# Patient Record
Sex: Female | Born: 1980 | Race: White | Hispanic: No | Marital: Married | Smoking: Never smoker
Health system: Southern US, Community
[De-identification: ages and names within clinical notes are randomized; demographics above are authoritative.]

## PROBLEM LIST (undated history)

## (undated) DIAGNOSIS — Z789 Other specified health status: Secondary | ICD-10-CM

## (undated) DIAGNOSIS — J45909 Unspecified asthma, uncomplicated: Secondary | ICD-10-CM

## (undated) DIAGNOSIS — D759 Disease of blood and blood-forming organs, unspecified: Secondary | ICD-10-CM

## (undated) DIAGNOSIS — E079 Disorder of thyroid, unspecified: Secondary | ICD-10-CM

## (undated) HISTORY — PX: TONSILLECTOMY: SUR1361

## (undated) HISTORY — PX: TUBAL LIGATION: SHX77

---
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
## 2002-09-14 ENCOUNTER — Emergency Department (HOSPITAL_COMMUNITY): Admission: EM | Admit: 2002-09-14 | Discharge: 2002-09-14 | Payer: Self-pay | Admitting: Emergency Medicine

## 2002-09-14 ENCOUNTER — Encounter: Payer: Self-pay | Admitting: Emergency Medicine

## 2003-09-11 ENCOUNTER — Other Ambulatory Visit: Admission: RE | Admit: 2003-09-11 | Discharge: 2003-09-11 | Payer: Self-pay | Admitting: Obstetrics and Gynecology

## 2004-06-17 ENCOUNTER — Ambulatory Visit: Payer: Self-pay | Admitting: Pulmonary Disease

## 2004-06-20 ENCOUNTER — Ambulatory Visit (HOSPITAL_COMMUNITY): Admission: RE | Admit: 2004-06-20 | Discharge: 2004-06-20 | Payer: Self-pay | Admitting: Pulmonary Disease

## 2004-07-04 ENCOUNTER — Ambulatory Visit: Payer: Self-pay | Admitting: Pulmonary Disease

## 2004-09-23 ENCOUNTER — Other Ambulatory Visit: Admission: RE | Admit: 2004-09-23 | Discharge: 2004-09-23 | Payer: Self-pay | Admitting: Obstetrics and Gynecology

## 2005-06-08 ENCOUNTER — Other Ambulatory Visit: Admission: RE | Admit: 2005-06-08 | Discharge: 2005-06-08 | Payer: Self-pay | Admitting: Obstetrics and Gynecology

## 2006-01-31 ENCOUNTER — Ambulatory Visit (HOSPITAL_COMMUNITY): Admission: RE | Admit: 2006-01-31 | Discharge: 2006-01-31 | Payer: Self-pay | Admitting: Otolaryngology

## 2008-12-18 ENCOUNTER — Observation Stay (HOSPITAL_COMMUNITY): Admission: AD | Admit: 2008-12-18 | Discharge: 2008-12-19 | Payer: Self-pay | Admitting: Obstetrics and Gynecology

## 2008-12-28 ENCOUNTER — Inpatient Hospital Stay (HOSPITAL_COMMUNITY): Admission: AD | Admit: 2008-12-28 | Discharge: 2008-12-30 | Payer: Self-pay | Admitting: Obstetrics and Gynecology

## 2009-01-04 ENCOUNTER — Inpatient Hospital Stay (HOSPITAL_COMMUNITY): Admission: AD | Admit: 2009-01-04 | Discharge: 2009-01-07 | Payer: Self-pay | Admitting: Obstetrics and Gynecology

## 2009-01-12 ENCOUNTER — Inpatient Hospital Stay (HOSPITAL_COMMUNITY): Admission: AD | Admit: 2009-01-12 | Discharge: 2009-01-15 | Payer: Self-pay | Admitting: Obstetrics and Gynecology

## 2010-03-21 ENCOUNTER — Encounter: Admission: RE | Admit: 2010-03-21 | Discharge: 2010-03-21 | Payer: Self-pay | Admitting: Physician Assistant

## 2010-05-15 HISTORY — PX: MYOMECTOMY: SHX85

## 2010-08-19 LAB — CCBB MATERNAL DONOR DRAW

## 2010-08-19 LAB — CBC
Hemoglobin: 10.8 g/dL — ABNORMAL LOW (ref 12.0–15.0)
RBC: 3.35 MIL/uL — ABNORMAL LOW (ref 3.87–5.11)
RDW: 13.9 % (ref 11.5–15.5)

## 2010-08-20 LAB — URINALYSIS, ROUTINE W REFLEX MICROSCOPIC
Hgb urine dipstick: NEGATIVE
Nitrite: NEGATIVE
Protein, ur: NEGATIVE mg/dL
Specific Gravity, Urine: 1.02 (ref 1.005–1.030)
Urobilinogen, UA: 0.2 mg/dL (ref 0.0–1.0)

## 2010-08-20 LAB — STREP B DNA PROBE
Strep Group B Ag: POSITIVE
Strep Group B Ag: POSITIVE

## 2010-08-20 LAB — CBC
Hemoglobin: 12 g/dL (ref 12.0–15.0)
MCHC: 33.9 g/dL (ref 30.0–36.0)
MCV: 93.3 fL (ref 78.0–100.0)
Platelets: 159 10*3/uL (ref 150–400)
Platelets: 176 10*3/uL (ref 150–400)
RBC: 3.91 MIL/uL (ref 3.87–5.11)
RBC: 4.06 MIL/uL (ref 3.87–5.11)
RDW: 13.4 % (ref 11.5–15.5)
WBC: 10.2 10*3/uL (ref 4.0–10.5)
WBC: 9.7 10*3/uL (ref 4.0–10.5)

## 2010-08-20 LAB — RPR
RPR Ser Ql: NONREACTIVE
RPR Ser Ql: NONREACTIVE

## 2010-09-27 NOTE — H&P (Signed)
NAME:  Julie [name], Julie [name]               ACCOUNT NO.:  [id number]   MEDICAL RECORD NO.:  [id number]          PATIENT TYPE:  INP   LOCATION:  9153                          [facility]ACILITY:  [facility]   PHYSICIAN:  Duke Salvia. Marcelle Overlie, M.D.DATE OF BIRTH:  Apr 23, 1981   DATE OF ADMISSION:  01/04/2009  DATE OF DISCHARGE:                              HISTORY & PHYSICAL   PRIORITY ADMISSION HISTORY AND PHYSICAL   CHIEF COMPLAINT:  Premature labor.   HISTORY OF PRESENT ILLNESS:  A 30 year old, G1, P0, at 33+ weeks  gestation, EDD of October 7th, was noted to be 3 to 4 cm dilated today  relatively asymptomatic, and was sent to the MAU for admission and  further management of preterm labor.  Of note that she was hospitalized  approximately 3 weeks ago at which time she received betamethasone  series and was started on bedrest at home along with tocolytics in the  form of Procardia 10 mg p.o. t.i.d.  Her blood type positive.  Her 3-  hour GTT has been normal.   Of note that she has had relative polyhydramnios and LGA noted.  Last  exam in our office on August 16th was noted to be 3 cm.   PAST MEDICAL HISTORY:   ALLERGIES:  None.   REVIEW OF SYSTEMS:  Mild asthma.   OTHER MEDICATIONS:  Procardia and prenatal vitamins.  Please see her  [place] form for the remainder of her past medical history.   PHYSICAL EXAMINATION:  VITAL SIGNS:  Temperature 98.2, blood pressure  120/78.  HEENT:  Unremarkable.  NECK:  Supple without masses.  LUNGS:  Clear.  CARDIOVASCULAR:  Regular rate and rhythm without murmurs, rubs, gallops  heard.  BREASTS:  Not examined.  ABDOMEN:  35 cm fundal height.  Fetal heart rate 140.  Cervix was 3 to  4, 80%, vertex, membranes intact.  EXTREMITIES/NEUROLOGIC:  Exam  unremarkable.   IMPRESSION:  1. A 33+ week intrauterine pregnancy.  2. History of large for gestational age and polyhydramnios with normal      3-hour glucose tolerance test.  3. Preterm labor.   PLAN:  Will admit  for bedrest, magnesium sulfate tocolysis, Group B  strep culturing, and further observation.      Richard M. Marcelle Overlie, M.D.  Electronically Signed     RMH/MEDQ  D:  01/04/2009  T:  01/04/2009  Job:  [id number]

## 2010-09-27 NOTE — H&P (Signed)
NAME:  [name], Julie               ACCOUNT NO.:  [id number]   MEDICAL RECORD NO.:  [id number]          PATIENT TYPE:  OBV   LOCATION:  9155                          [facility]:  [facility]   PHYSICIAN:  Dineen Kid. Rana Snare, M.D.    DATE OF BIRTH:  Aug 20, 1980   DATE OF ADMISSION:  12/18/2008  DATE OF DISCHARGE:                              HISTORY & PHYSICAL   HISTORY OF PRESENT ILLNESS:  Ms. Hittle is a 30 year old, G1.  She is at  31-1/7 weeks and was seen in the office by my partner, Dr. Arelia Sneddon.  She  had an ultrasound evaluation for history of polyhydramnios today, noted  to have a vertex fetus weighing 4 pounds 2 ounces which is 87%, AFI is  18.5 which is 78th percentile.  However, the cervical length was noted  to be 1 cm with funneling.  Dr. Arelia Sneddon checked her and found she was  fingertip and long, and sent her to [facility] for further  evaluation.  At [facility], noted to have occasional contractions,  not repetitive, however.  Her EDC is February 18, 2009.  Pregnancy has  otherwise been uncomplicated.  She did have a normal first trimester  screen.  She did have a previous low-lying placenta which had resolved.   PAST MEDICAL HISTORY:  She has mild asthma.   PAST SURGICAL HISTORY:  Tonsillectomy in October 2009.   MEDICATIONS:  Two __________ vitamins a day.   She has no known drug allergies.   PHYSICAL EXAM:  By Dr. Arelia Sneddon.  Blood pressure 120/64.  Fetal heart  rates in the 150s to 160s.  Cervical length is fingertip and long.   IMPRESSION AND PLAN:  Intrauterine pregnancy at 31-1/7 weeks with  cervical change and funneling.   PLAN:  In-hospital monitoring for preterm labor.  We will go ahead and  begin her on IV Unasyn after we do a group B strep culture.  Recommend  betamethasone therapy.  If she is contracting, consider tocolysis.      Dineen Kid Rana Snare, M.D.  Electronically Signed     DCL/MEDQ  D:  12/18/2008  T:  12/18/2008  Job:  [id number]

## 2010-09-27 NOTE — Discharge Summary (Signed)
NAME:  Julie [name], Julie [name]               ACCOUNT NO.:  [id number]   MEDICAL RECORD NO.:  [id number]          PATIENT TYPE:  INP   LOCATION:  9151                          [facility]:  [facility]   PHYSICIAN:  Guy Sandifer. Henderson Cloud, M.D. DATE OF BIRTH:  1980/05/24   DATE OF ADMISSION:  12/28/2008  DATE OF DISCHARGE:  12/30/2008                               DISCHARGE SUMMARY   ADMITTING DIAGNOSES:  1. Intrauterine pregnancy at 70 and 4/7 weeks' estimated additional      age.  2. Preterm labor.   DISCHARGE DIAGNOSES:  1. Intrauterine pregnancy at 71 and 4/7 weeks' estimated additional      age.  2. Preterm labor.   REASON FOR ADMISSION:  The patient is a 30 year old married Hostetler female  G1, P0 at 31 and 4/7 weeks, who had a positive fetal fibronectin on November 21, 2008.  Cervix was noted to be shortened and she had been on the  hospital about a week ago for preterm contractions.  She is on oral  Procardia and bedrest at home.  She presented to the office complaining  of uterine contractions.  Cervix is 3 cm, 80% effaced, -2 station and  vertex.  She is admitted for further management.   HOSPITAL COURSE:  The patient was admitted to the hospital, undergoes IV  fluids, magnesium sulfate tocolysis.  She has had steroids the prior  admission.  Ultrasound on December 29, 2008, reveals a vertex baby with an  estimated fetal weight of 76th percentile, a normal amniotic fluid.  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  She  responds well to the magnesium.  The magnesium was waned on the evening  of December 29, 2008, and on the day of discharge, she is doing well with  mild uterine irritability, on Procardia 20 mg q.8 h.  Baby remains  reactive on the monitor.   CONDITION ON DISCHARGE:  Good.   DIET:  Regular as tolerated.   ACTIVITY:  Modified bedrest and pelvic rest is discussed with the  patient.   MEDICATIONS:  1. Procardia 20 mg q.8 h.  2. Prenatal vitamin daily.   FOLLOWUP:  Follow up in the office in 5 days.     Guy Sandifer Henderson Cloud, M.D.  Electronically Signed    JET/MEDQ  D:  12/30/2008  T:  12/31/2008  Job:  [id number]

## 2010-11-27 IMAGING — US US OB COMP +14 WK
3 series · 14 of 28 positions shown · non-contrast
Comparison: none

OBSTETRICAL ULTRASOUND:
 This ultrasound exam was performed in the [HOSPITAL] Ultrasound Department.  The OB US report was generated in the AS system, and faxed to the ordering physician.  This report is also available in [REDACTED] PACS.

[Series 1: us ob comp +14 wk · 0.41mm/px · 43 acquisitions, 11 frames shown (1 of 3)]
[im 2/43]
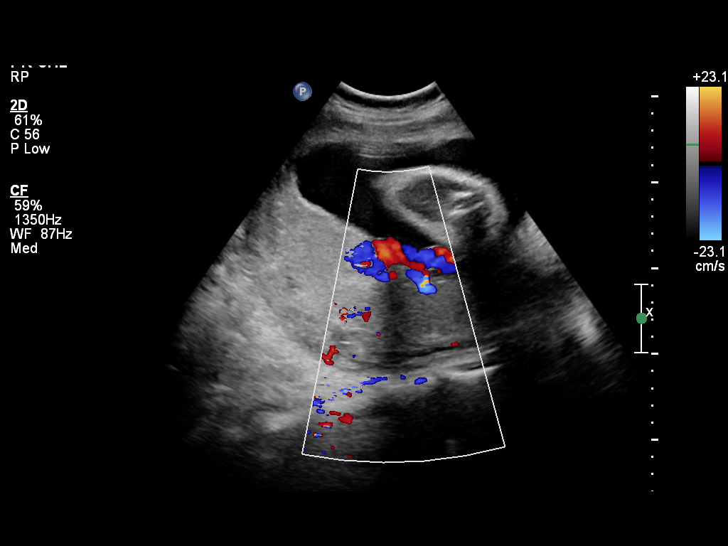
[im 6/43]
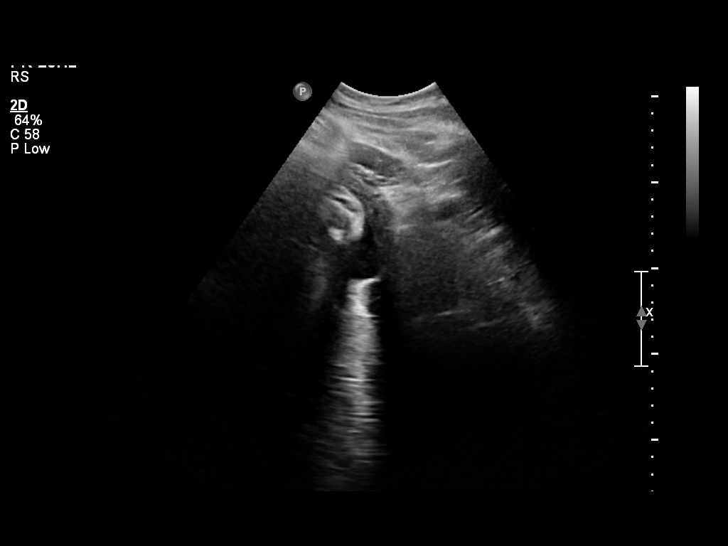
[im 10/43]
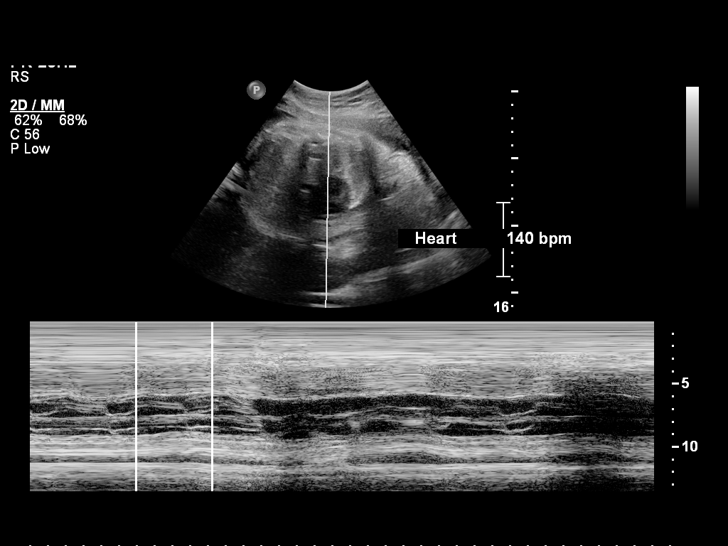
[im 14/43]
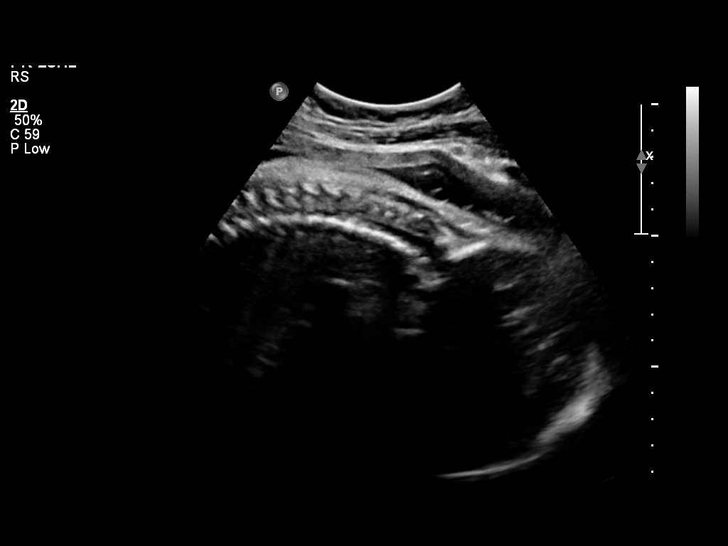
[im 18/43]
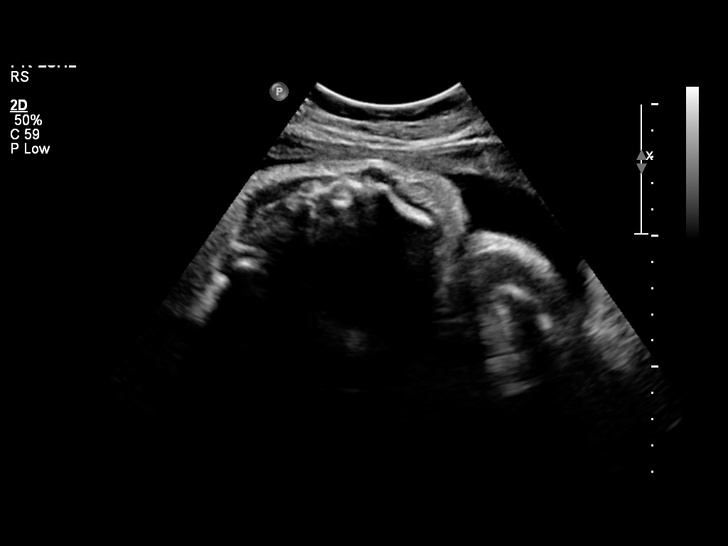
[im 22/43]
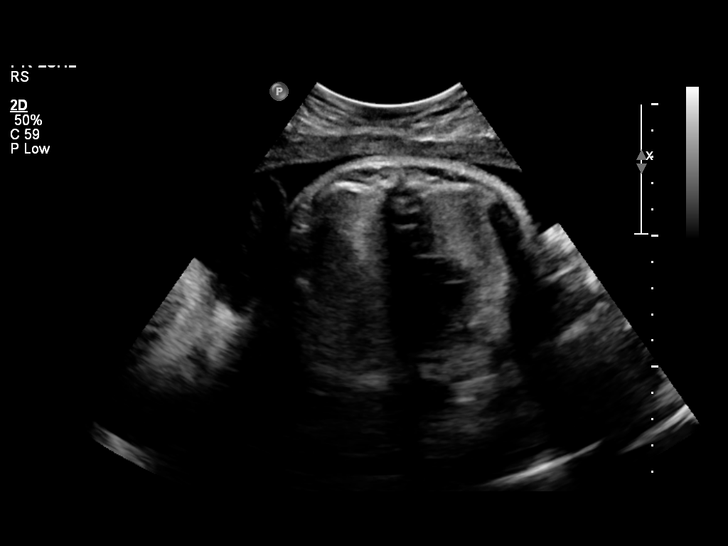
[im 25/43]
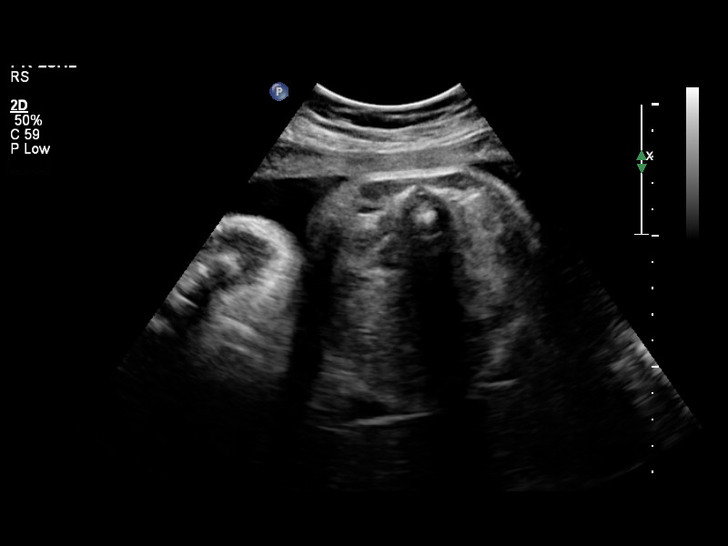
[im 29/43]
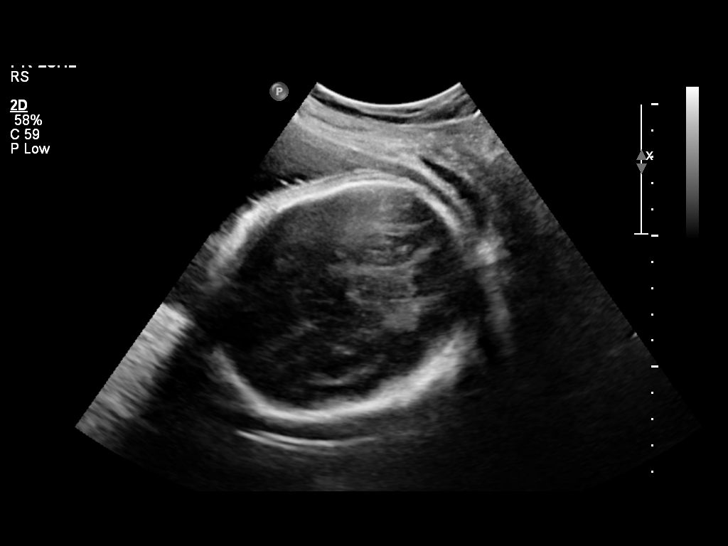
[im 33/43]
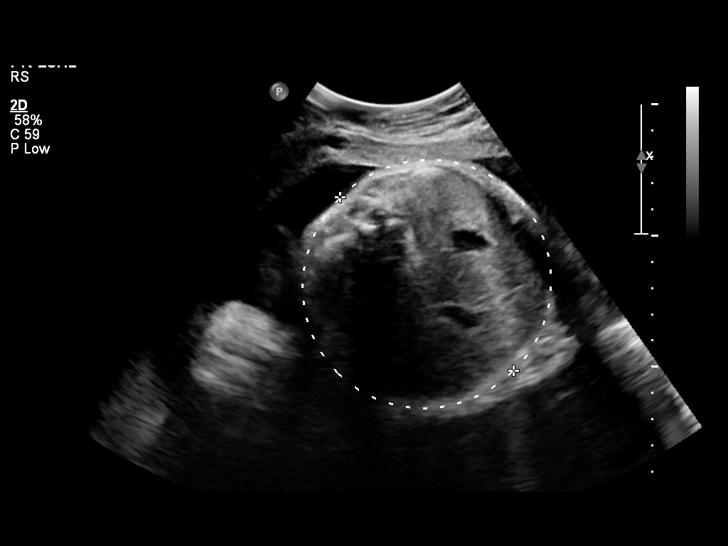
[im 37/43]
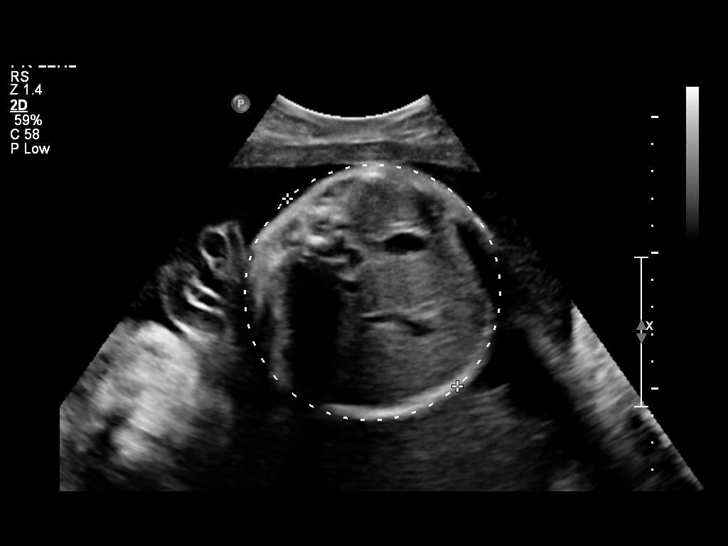
[im 41/43]
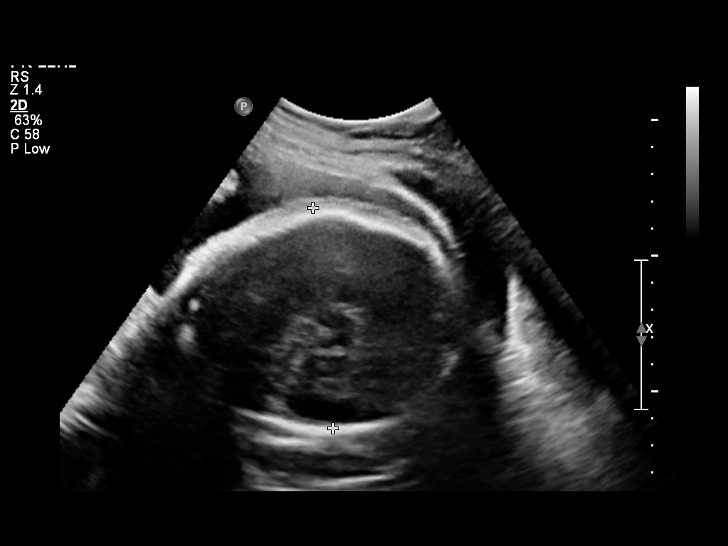

[Series 1: us ob comp +14 wk · 0.30mm/px · 2 of 8 slices shown (2 of 3)]
[im 1/8]
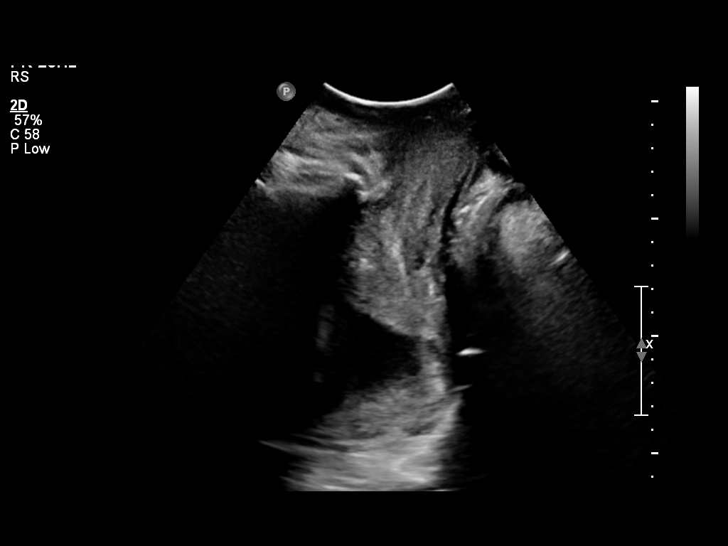
[im 5/8]
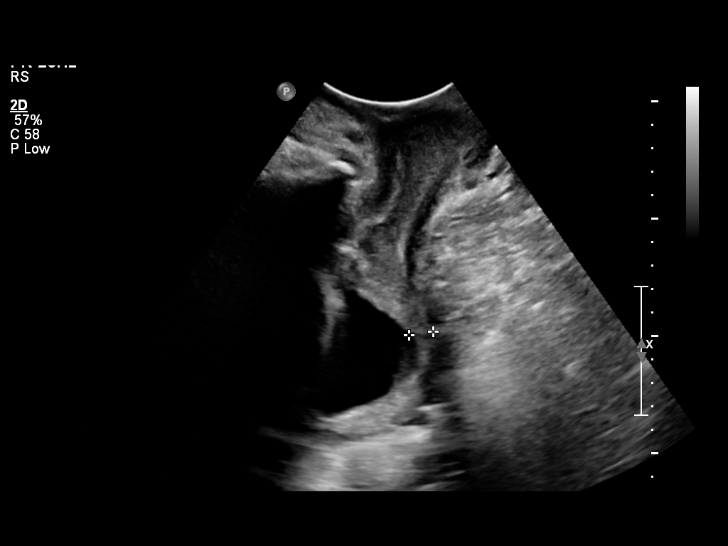

[Series 1: us ob comp +14 wk · 0.37mm/px · 1 of 1 slices shown (3 of 3)]
[im 1/1]
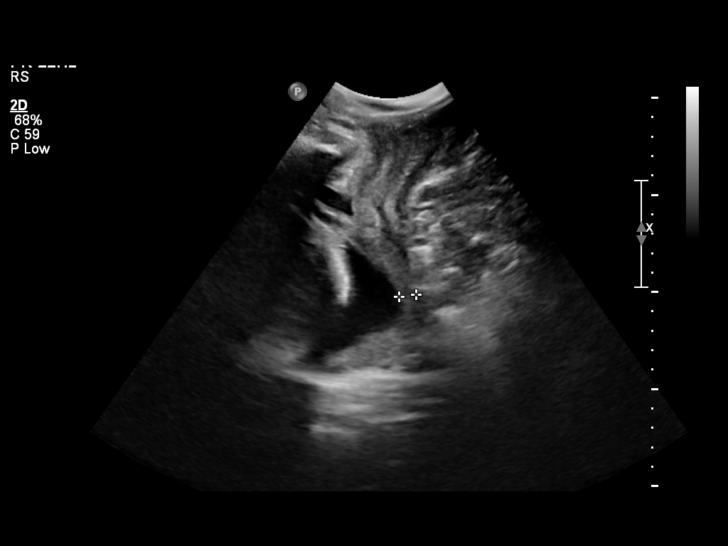

[14 of 28 positions shown; findings below may reference images not displayed]

IMPRESSION: See AS Obstetric US report.

## 2012-05-09 LAB — OB RESULTS CONSOLE ANTIBODY SCREEN: Antibody Screen: NEGATIVE

## 2012-05-09 LAB — OB RESULTS CONSOLE ABO/RH

## 2012-11-20 ENCOUNTER — Encounter (HOSPITAL_COMMUNITY): Payer: Self-pay | Admitting: Pharmacist

## 2012-11-29 ENCOUNTER — Encounter (HOSPITAL_COMMUNITY): Payer: Self-pay

## 2012-12-02 ENCOUNTER — Encounter (HOSPITAL_COMMUNITY): Payer: Self-pay

## 2012-12-02 ENCOUNTER — Inpatient Hospital Stay (HOSPITAL_COMMUNITY)
Admission: AD | Admit: 2012-12-02 | Discharge: 2012-12-07 | DRG: 371 | Disposition: A | Discharge status: Home / Self Care | Payer: BC Managed Care – PPO | Source: Ambulatory Visit | Attending: Obstetrics and Gynecology | Admitting: Obstetrics and Gynecology

## 2012-12-02 ENCOUNTER — Encounter (HOSPITAL_COMMUNITY)
Admission: RE | Admit: 2012-12-02 | Discharge: 2012-12-02 | Disposition: A | Discharge status: Home / Self Care | Payer: BC Managed Care – PPO | Source: Ambulatory Visit | Attending: Obstetrics and Gynecology | Admitting: Obstetrics and Gynecology

## 2012-12-02 DIAGNOSIS — Z302 Encounter for sterilization: Secondary | ICD-10-CM

## 2012-12-02 DIAGNOSIS — O3490 Maternal care for abnormality of pelvic organ, unspecified, unspecified trimester: Principal | ICD-10-CM | POA: Diagnosis present

## 2012-12-02 HISTORY — DX: Other specified health status: Z78.9

## 2012-12-02 LAB — TYPE AND SCREEN
ABO/RH(D): O POS
Antibody Screen: NEGATIVE

## 2012-12-02 LAB — CBC
HCT: 34.1 % — ABNORMAL LOW (ref 36.0–46.0)
Hemoglobin: 11.3 g/dL — ABNORMAL LOW (ref 12.0–15.0)
MCHC: 33.1 g/dL (ref 30.0–36.0)
RDW: 14.3 % (ref 11.5–15.5)
WBC: 7.3 10*3/uL (ref 4.0–10.5)

## 2012-12-02 LAB — ABO/RH: ABO/RH(D): O POS

## 2012-12-02 NOTE — Patient Instructions (Addendum)
Your procedure is scheduled on:12/04/12  Enter through the Main Entrance at :1130 am Pick up desk phone and dial [phone] and inform us of your arrival.  Please call [phone] if you have any problems the morning of surgery.  Remember: Do not eat food after midnight:Tuesday Clear liquids are ok until:9am Wed.   You may brush your teeth the morning of surgery.    DO NOT wear jewelry, eye make-up, lipstick,body lotion, or dark fingernail polish.  (Polished toes are ok) You may wear deodorant.  If you are to be admitted after surgery, leave suitcase in car until your room has been assigned.

## 2012-12-02 NOTE — Pre-Procedure Instructions (Signed)
Dr. Malen Gauze notified of pt's low platelet count and he has ordered a repeat test on DOS.

## 2012-12-04 ENCOUNTER — Encounter (HOSPITAL_COMMUNITY): Payer: Self-pay | Admitting: Anesthesiology

## 2012-12-04 ENCOUNTER — Encounter (HOSPITAL_COMMUNITY): Payer: Self-pay | Admitting: Certified Registered"

## 2012-12-04 ENCOUNTER — Inpatient Hospital Stay (HOSPITAL_COMMUNITY): Payer: BC Managed Care – PPO | Admitting: Anesthesiology

## 2012-12-04 ENCOUNTER — Encounter (HOSPITAL_COMMUNITY)
Admission: AD | Disposition: A | Discharge status: Home / Self Care | Payer: Self-pay | Source: Ambulatory Visit | Attending: Obstetrics and Gynecology

## 2012-12-04 LAB — CBC
MCH: 29.5 pg (ref 26.0–34.0)
Platelets: 131 10*3/uL — ABNORMAL LOW (ref 150–400)
RBC: 3.96 MIL/uL (ref 3.87–5.11)
RDW: 13.9 % (ref 11.5–15.5)

## 2012-12-04 SURGERY — Surgical Case
Anesthesia: Spinal | Laterality: Bilateral | Wound class: Clean Contaminated

## 2012-12-04 MED ORDER — ONDANSETRON HCL 4 MG PO TABS: 4.0000 mg | ORAL_TABLET | ORAL | Status: DC | PRN

## 2012-12-04 MED ORDER — NALOXONE HCL 0.4 MG/ML IJ SOLN: 0.4000 mg | INTRAMUSCULAR | Status: DC | PRN

## 2012-12-04 MED ORDER — NALOXONE HCL 1 MG/ML IJ SOLN
1.0000 ug/kg/h | INTRAVENOUS | Status: DC | PRN
  Filled 2012-12-04: qty 2

## 2012-12-04 MED ORDER — NALBUPHINE HCL 10 MG/ML IJ SOLN: 5.0000 mg | INTRAMUSCULAR | Status: DC | PRN

## 2012-12-04 MED ORDER — BUPIVACAINE IN DEXTROSE 0.75-8.25 % IT SOLN
INTRATHECAL | Status: DC | PRN
  Administered 2012-12-04: 1.4 mL via INTRATHECAL

## 2012-12-04 MED ORDER — KETOROLAC TROMETHAMINE 60 MG/2ML IM SOLN
INTRAMUSCULAR | Status: AC
  Administered 2012-12-04: 60 mg via INTRAMUSCULAR
  Filled 2012-12-04: qty 2

## 2012-12-04 MED ORDER — TETANUS-DIPHTH-ACELL PERTUSSIS 5-2.5-18.5 LF-MCG/0.5 IM SUSP: 0.5000 mL | Freq: Once | INTRAMUSCULAR | Status: DC

## 2012-12-04 MED ORDER — KETOROLAC TROMETHAMINE 30 MG/ML IJ SOLN: 30.0000 mg | Freq: Four times a day (QID) | INTRAMUSCULAR | Status: AC | PRN

## 2012-12-04 MED ORDER — PHENYLEPHRINE 40 MCG/ML (10ML) SYRINGE FOR IV PUSH (FOR BLOOD PRESSURE SUPPORT)
PREFILLED_SYRINGE | INTRAVENOUS | Status: AC
  Filled 2012-12-04: qty 10

## 2012-12-04 MED ORDER — LACTATED RINGERS IV SOLN
INTRAVENOUS | Status: DC
  Administered 2012-12-04 (×3): via INTRAVENOUS

## 2012-12-04 MED ORDER — DIPHENHYDRAMINE HCL 25 MG PO CAPS: 25.0000 mg | ORAL_CAPSULE | Freq: Four times a day (QID) | ORAL | Status: DC | PRN

## 2012-12-04 MED ORDER — OXYTOCIN 40 UNITS IN LACTATED RINGERS INFUSION - SIMPLE MED: 62.5000 mL/h | INTRAVENOUS | Status: AC

## 2012-12-04 MED ORDER — MORPHINE SULFATE (PF) 0.5 MG/ML IJ SOLN
INTRAMUSCULAR | Status: DC | PRN
  Administered 2012-12-04: .2 mg via INTRATHECAL

## 2012-12-04 MED ORDER — ONDANSETRON HCL 4 MG/2ML IJ SOLN
INTRAMUSCULAR | Status: AC
  Filled 2012-12-04: qty 2

## 2012-12-04 MED ORDER — KETOROLAC TROMETHAMINE 60 MG/2ML IM SOLN: 60.0000 mg | Freq: Once | INTRAMUSCULAR | Status: AC | PRN

## 2012-12-04 MED ORDER — KETOROLAC TROMETHAMINE 30 MG/ML IJ SOLN: 15.0000 mg | Freq: Once | INTRAMUSCULAR | Status: DC | PRN

## 2012-12-04 MED ORDER — MORPHINE SULFATE 0.5 MG/ML IJ SOLN
INTRAMUSCULAR | Status: AC
  Filled 2012-12-04: qty 10

## 2012-12-04 MED ORDER — DIPHENHYDRAMINE HCL 50 MG/ML IJ SOLN: 25.0000 mg | INTRAMUSCULAR | Status: DC | PRN

## 2012-12-04 MED ORDER — OXYCODONE-ACETAMINOPHEN 5-325 MG PO TABS
1.0000 | ORAL_TABLET | ORAL | Status: DC | PRN
  Administered 2012-12-05 – 2012-12-07 (×8): 1 via ORAL
  Filled 2012-12-04 (×8): qty 1

## 2012-12-04 MED ORDER — FENTANYL CITRATE 0.05 MG/ML IJ SOLN
INTRAMUSCULAR | Status: DC | PRN
  Administered 2012-12-04: 12.5 ug via INTRATHECAL

## 2012-12-04 MED ORDER — OXYTOCIN 10 UNIT/ML IJ SOLN
40.0000 [IU] | INTRAVENOUS | Status: DC | PRN
  Administered 2012-12-04: 40 [IU] via INTRAVENOUS

## 2012-12-04 MED ORDER — CEFAZOLIN SODIUM-DEXTROSE 2-3 GM-% IV SOLR
2.0000 g | INTRAVENOUS | Status: AC
  Administered 2012-12-04: 2 g via INTRAVENOUS

## 2012-12-04 MED ORDER — SODIUM CHLORIDE 0.9 % IJ SOLN: 3.0000 mL | INTRAMUSCULAR | Status: DC | PRN

## 2012-12-04 MED ORDER — CEFAZOLIN SODIUM-DEXTROSE 2-3 GM-% IV SOLR
INTRAVENOUS | Status: AC
  Filled 2012-12-04: qty 50

## 2012-12-04 MED ORDER — SIMETHICONE 80 MG PO CHEW
80.0000 mg | CHEWABLE_TABLET | Freq: Three times a day (TID) | ORAL | Status: DC
  Administered 2012-12-04 – 2012-12-07 (×10): 80 mg via ORAL

## 2012-12-04 MED ORDER — DIPHENHYDRAMINE HCL 25 MG PO CAPS
25.0000 mg | ORAL_CAPSULE | ORAL | Status: DC | PRN
  Filled 2012-12-04: qty 1

## 2012-12-04 MED ORDER — IBUPROFEN 600 MG PO TABS
600.0000 mg | ORAL_TABLET | Freq: Four times a day (QID) | ORAL | Status: DC
  Administered 2012-12-05 – 2012-12-07 (×9): 600 mg via ORAL
  Filled 2012-12-04 (×9): qty 1

## 2012-12-04 MED ORDER — LACTATED RINGERS IV SOLN
INTRAVENOUS | Status: DC
  Administered 2012-12-04 – 2012-12-05 (×2): via INTRAVENOUS

## 2012-12-04 MED ORDER — SCOPOLAMINE 1 MG/3DAYS TD PT72: 1.0000 | MEDICATED_PATCH | Freq: Once | TRANSDERMAL | Status: DC

## 2012-12-04 MED ORDER — FENTANYL CITRATE 0.05 MG/ML IJ SOLN
INTRAMUSCULAR | Status: AC
  Filled 2012-12-04: qty 2

## 2012-12-04 MED ORDER — METOCLOPRAMIDE HCL 5 MG/ML IJ SOLN: 10.0000 mg | Freq: Three times a day (TID) | INTRAMUSCULAR | Status: DC | PRN

## 2012-12-04 MED ORDER — ONDANSETRON HCL 4 MG/2ML IJ SOLN: 4.0000 mg | Freq: Three times a day (TID) | INTRAMUSCULAR | Status: DC | PRN

## 2012-12-04 MED ORDER — PROMETHAZINE HCL 25 MG/ML IJ SOLN: 6.2500 mg | INTRAMUSCULAR | Status: DC | PRN

## 2012-12-04 MED ORDER — HYDROMORPHONE HCL PF 1 MG/ML IJ SOLN: 0.2500 mg | INTRAMUSCULAR | Status: DC | PRN

## 2012-12-04 MED ORDER — SENNOSIDES-DOCUSATE SODIUM 8.6-50 MG PO TABS
2.0000 | ORAL_TABLET | Freq: Every day | ORAL | Status: DC
  Administered 2012-12-05 – 2012-12-06 (×4): 2 via ORAL

## 2012-12-04 MED ORDER — PRENATAL MULTIVITAMIN CH
1.0000 | ORAL_TABLET | Freq: Every day | ORAL | Status: DC
  Administered 2012-12-05 – 2012-12-06 (×2): 1 via ORAL
  Filled 2012-12-04 (×2): qty 1

## 2012-12-04 MED ORDER — SIMETHICONE 80 MG PO CHEW: 80.0000 mg | CHEWABLE_TABLET | ORAL | Status: DC | PRN

## 2012-12-04 MED ORDER — DIPHENHYDRAMINE HCL 50 MG/ML IJ SOLN: 12.5000 mg | INTRAMUSCULAR | Status: DC | PRN

## 2012-12-04 MED ORDER — 0.9 % SODIUM CHLORIDE (POUR BTL) OPTIME
TOPICAL | Status: DC | PRN
  Administered 2012-12-04: 1000 mL

## 2012-12-04 MED ORDER — NALBUPHINE SYRINGE 5 MG/0.5 ML
INJECTION | INTRAMUSCULAR | Status: AC
  Administered 2012-12-04: 10 mg via SUBCUTANEOUS
  Filled 2012-12-04: qty 1

## 2012-12-04 MED ORDER — SCOPOLAMINE 1 MG/3DAYS TD PT72
MEDICATED_PATCH | TRANSDERMAL | Status: AC
  Administered 2012-12-04: 1.5 mg via TRANSDERMAL
  Filled 2012-12-04: qty 1

## 2012-12-04 MED ORDER — DIBUCAINE 1 % RE OINT: 1.0000 "application " | TOPICAL_OINTMENT | RECTAL | Status: DC | PRN

## 2012-12-04 MED ORDER — MEPERIDINE HCL 25 MG/ML IJ SOLN: 6.2500 mg | INTRAMUSCULAR | Status: DC | PRN

## 2012-12-04 MED ORDER — LANOLIN HYDROUS EX OINT: 1.0000 "application " | TOPICAL_OINTMENT | CUTANEOUS | Status: DC | PRN

## 2012-12-04 MED ORDER — KETOROLAC TROMETHAMINE 30 MG/ML IJ SOLN
30.0000 mg | Freq: Four times a day (QID) | INTRAMUSCULAR | Status: AC | PRN
  Administered 2012-12-04: 30 mg via INTRAVENOUS
  Filled 2012-12-04: qty 1

## 2012-12-04 MED ORDER — OXYTOCIN 10 UNIT/ML IJ SOLN
INTRAMUSCULAR | Status: AC
  Filled 2012-12-04: qty 4

## 2012-12-04 MED ORDER — MENTHOL 3 MG MT LOZG: 1.0000 | LOZENGE | OROMUCOSAL | Status: DC | PRN

## 2012-12-04 MED ORDER — ZOLPIDEM TARTRATE 5 MG PO TABS: 5.0000 mg | ORAL_TABLET | Freq: Every evening | ORAL | Status: DC | PRN

## 2012-12-04 MED ORDER — WITCH HAZEL-GLYCERIN EX PADS: 1.0000 "application " | MEDICATED_PAD | CUTANEOUS | Status: DC | PRN

## 2012-12-04 MED ORDER — ONDANSETRON HCL 4 MG/2ML IJ SOLN
INTRAMUSCULAR | Status: DC | PRN
  Administered 2012-12-04: 4 mg via INTRAVENOUS

## 2012-12-04 MED ORDER — ONDANSETRON HCL 4 MG/2ML IJ SOLN: 4.0000 mg | INTRAMUSCULAR | Status: DC | PRN

## 2012-12-04 MED ORDER — PHENYLEPHRINE HCL 10 MG/ML IJ SOLN
INTRAMUSCULAR | Status: DC | PRN
  Administered 2012-12-04 (×3): 80 ug via INTRAVENOUS
  Administered 2012-12-04: 40 ug via INTRAVENOUS
  Administered 2012-12-04 (×2): 80 ug via INTRAVENOUS

## 2012-12-04 MED ORDER — NALBUPHINE HCL 10 MG/ML IJ SOLN
5.0000 mg | INTRAMUSCULAR | Status: DC | PRN
  Administered 2012-12-04 – 2012-12-05 (×2): 5 mg via INTRAVENOUS
  Filled 2012-12-04 (×2): qty 1

## 2012-12-04 SURGICAL SUPPLY — 31 items
ADH SKN CLS APL DERMABOND .7 (GAUZE/BANDAGES/DRESSINGS)
APL SKNCLS STERI-STRIP NONHPOA (GAUZE/BANDAGES/DRESSINGS) ×1
BENZOIN TINCTURE PRP APPL 2/3 (GAUZE/BANDAGES/DRESSINGS) ×2 IMPLANT
CLAMP CORD UMBIL (MISCELLANEOUS) IMPLANT
CLOTH BEACON ORANGE TIMEOUT ST (SAFETY) ×2 IMPLANT
CONTAINER PREFILL 10% NBF 15ML (MISCELLANEOUS) ×3 IMPLANT
DERMABOND ADVANCED (GAUZE/BANDAGES/DRESSINGS)
DERMABOND ADVANCED .7 DNX12 (GAUZE/BANDAGES/DRESSINGS) IMPLANT
DRAPE LG THREE QUARTER DISP (DRAPES) ×2 IMPLANT
DRSG OPSITE POSTOP 4X10 (GAUZE/BANDAGES/DRESSINGS) ×2 IMPLANT
DURAPREP 26ML APPLICATOR (WOUND CARE) ×2 IMPLANT
ELECT REM PT RETURN 9FT ADLT (ELECTROSURGICAL) ×2
ELECTRODE REM PT RTRN 9FT ADLT (ELECTROSURGICAL) ×1 IMPLANT
EXTRACTOR VACUUM M CUP 4 TUBE (SUCTIONS) IMPLANT
GLOVE BIO SURGEON STRL SZ7.5 (GLOVE) ×2 IMPLANT
GOWN STRL REIN XL XLG (GOWN DISPOSABLE) ×4 IMPLANT
KIT ABG SYR 3ML LUER SLIP (SYRINGE) ×2 IMPLANT
NEEDLE HYPO 25X5/8 SAFETYGLIDE (NEEDLE) ×2 IMPLANT
NS IRRIG 1000ML POUR BTL (IV SOLUTION) ×2 IMPLANT
PACK C SECTION WH (CUSTOM PROCEDURE TRAY) ×2 IMPLANT
PAD OB MATERNITY 4.3X12.25 (PERSONAL CARE ITEMS) ×2 IMPLANT
STAPLER VISISTAT 35W (STAPLE) IMPLANT
STRIP CLOSURE SKIN 1/2X4 (GAUZE/BANDAGES/DRESSINGS) ×2 IMPLANT
SUT MNCRL 0 VIOLET CTX 36 (SUTURE) ×4 IMPLANT
SUT MONOCRYL 0 CTX 36 (SUTURE) ×4
SUT PDS AB 0 CTX 60 (SUTURE) ×2 IMPLANT
SUT PLAIN 0 NONE (SUTURE) IMPLANT
SUT VIC AB 4-0 KS 27 (SUTURE) ×2 IMPLANT
TOWEL OR 17X24 6PK STRL BLUE (TOWEL DISPOSABLE) ×6 IMPLANT
TRAY FOLEY CATH 14FR (SET/KITS/TRAYS/PACK) ×2 IMPLANT
WATER STERILE IRR 1000ML POUR (IV SOLUTION) ×2 IMPLANT

## 2012-12-04 NOTE — Anesthesia Procedure Notes (Signed)
Spinal  Patient location during procedure: OR Start time: 12/04/2012 1:18 PM End time: 12/04/2012 1:20 PM Staffing Anesthesiologist: Sandrea Hughs Performed by: anesthesiologist  Preanesthetic Checklist Completed: patient identified, surgical consent, pre-op evaluation, timeout performed, IV checked, risks and benefits discussed and monitors and equipment checked Spinal Block Patient position: sitting Prep: DuraPrep Patient monitoring: heart rate, cardiac monitor, continuous pulse ox and blood pressure Approach: midline Location: L3-4 Injection technique: single-shot Needle Needle type: Sprotte  Needle gauge: 24 G Needle length: 9 cm Needle insertion depth: 5 cm Assessment Sensory level: T4

## 2012-12-04 NOTE — Anesthesia Postprocedure Evaluation (Signed)
Anesthesia Post Note  Patient: Julie [name]  Procedure(s) Performed: Procedure(s) (LRB): PRIMARY CESAREAN SECTION WITH BILATERAL TUBAL LIGATION (Bilateral)  Anesthesia type: Spinal  Patient location: PACU  Post pain: Pain level controlled  Post assessment: Post-op Vital signs reviewed  Last Vitals:  Filed Vitals:   12/04/12 1438  BP:   Pulse: 67  Temp: 36.5 C  Resp: 16    Post vital signs: Reviewed  Level of consciousness: awake  Complications: No apparent anesthesia complications

## 2012-12-04 NOTE — Preoperative (Signed)
Beta Blockers   Reason not to administer Beta Blockers:Not Applicable 

## 2012-12-04 NOTE — Brief Op Note (Signed)
12/04/2012  2:27 PM  PATIENT:  Julie [name]  32 y.o. female  PRE-OPERATIVE DIAGNOSIS:  PRIMARY Cesarean SECTION, PREVIOUS MYOMECTOMY, DESIRES STERILIZATION   POST-OPERATIVE DIAGNOSIS:  Previous myomectomy, desires permanent sterilization  PROCEDURE:  Procedure(s) with comments: PRIMARY CESAREAN SECTION WITH BILATERAL TUBAL LIGATION (Bilateral) - PRIMARY  edc 8/5, biopsy of uterine fundus  SURGEON:  Surgeon(s) and Role:    * Leslie Andrea, MD - Primary    * Mitchel Honour, DO - Assisting  PHYSICIAN ASSISTANT:   ASSISTANTS: Megan Morris DO   ANESTHESIA:   spinal  EBL:  Total I/O In: 2000 [I.V.:2000] Out: 650 [Urine:150; Blood:500]  BLOOD ADMINISTERED:none  DRAINS: Urinary Catheter (Foley)   LOCAL MEDICATIONS USED:  NONE  SPECIMEN:  No Specimen  DISPOSITION OF SPECIMEN:  PATHOLOGY  COUNTS:  YES  TOURNIQUET:  * No tourniquets in log *  DICTATION: .Dragon Dictation and Other Dictation: Dictation Number [id number]  PLAN OF CARE: Admit to inpatient   PATIENT DISPOSITION:  PACU - hemodynamically stable.   Delay start of Pharmacological VTE agent (>24hrs) due to surgical blood loss or risk of bleeding: not applicable

## 2012-12-04 NOTE — Anesthesia Preprocedure Evaluation (Signed)
Anesthesia Evaluation  Patient identified by MRN, date of birth, ID band Patient awake    Reviewed: Allergy & Precautions, H&P , NPO status , Patient's Chart, lab work & pertinent test results  Airway Mallampati: I TM Distance: >3 FB Neck ROM: full    Dental no notable dental hx.    Pulmonary neg pulmonary ROS,    Pulmonary exam normal       Cardiovascular negative cardio ROS      Neuro/Psych negative neurological ROS  negative psych ROS   GI/Hepatic negative GI ROS, Neg liver ROS,   Endo/Other  negative endocrine ROS  Renal/GU negative Renal ROS  negative genitourinary   Musculoskeletal negative musculoskeletal ROS (+)   Abdominal Normal abdominal exam  (+)   Peds negative pediatric ROS (+)  Hematology negative hematology ROS (+)   Anesthesia Other Findings   Reproductive/Obstetrics (+) Pregnancy                           Anesthesia Physical Anesthesia Plan  ASA: II  Anesthesia Plan: Spinal   Post-op Pain Management:    Induction:   Airway Management Planned:   Additional Equipment:   Intra-op Plan:   Post-operative Plan:   Informed Consent: I have reviewed the patients History and Physical, chart, labs and discussed the procedure including the risks, benefits and alternatives for the proposed anesthesia with the patient or authorized representative who has indicated his/her understanding and acceptance.     Plan Discussed with: CRNA and Surgeon  Anesthesia Plan Comments:         Anesthesia Quick Evaluation

## 2012-12-04 NOTE — H&P (Signed)
Analyse Angst is a 32 y.o. female presenting for primary cesarean section and BTL secondary to hx of uterine myomectomy.  Maternal Medical History:  Fetal activity: Perceived fetal activity is normal.      OB History   Grav Para Term Preterm Abortions TAB SAB Ect Mult Living   2 1  1      1      Past Medical History  Diagnosis Date  . Medical history non-contributory    Past Surgical History  Procedure Laterality Date  . Myomectomy  2012   Family History: family history is not on file. Social History:  reports that she has never smoked. She does not have any smokeless tobacco history on file. She reports that she does not drink alcohol or use illicit drugs.   Prenatal Transfer Tool  Maternal Diabetes: No Genetic Screening: Normal Maternal Ultrasounds/Referrals: Normal Fetal Ultrasounds or other Referrals:  None Maternal Substance Abuse:  No Significant Maternal Medications:  None Significant Maternal Lab Results:  None Other Comments:  None  Review of Systems  Eyes: Negative for blurred vision.  Gastrointestinal: Negative for abdominal pain.  Neurological: Negative for headaches.      Blood pressure 133/88, pulse 82, temperature 98.1 F (36.7 C), temperature source Oral, resp. rate 18, last menstrual period 03/12/2012, SpO2 100.00%. Exam Physical Exam  Cardiovascular: Normal rate and regular rhythm.   Respiratory: Effort normal and breath sounds normal.  GI: Soft. There is no tenderness.  Neurological: She has normal reflexes.    Prenatal labs: ABO, Rh: --/--/O POS, O POS (07/21 0920) Antibody: NEG (07/21 0920) Rubella: Immune (12/26 0000) RPR: NON REACTIVE (07/21 0920)  HBsAg: Negative (12/26 0000)  HIV: Non-reactive (12/26 0000)  GBS:     Assessment/Plan: 32 yo G2P1 for C/S and BTL D/W patient risks-infection, organ damage, DVT/PE, bleeding/transfusion-HIV/Hep, pneumonia.  D/W BTL and permanence, alternatives, failure rate and increased ectopic  risk.   Jamee Pacholski II,Labib Cwynar E 12/04/2012, 12:50 PM

## 2012-12-04 NOTE — Transfer of Care (Signed)
Immediate Anesthesia Transfer of Care Note  Patient: Julie [name]  Procedure(s) Performed: Procedure(s) with comments: PRIMARY CESAREAN SECTION WITH BILATERAL TUBAL LIGATION (Bilateral) - PRIMARY  edc 8/5  Patient Location: PACU  Anesthesia Type:Spinal  Level of Consciousness: awake, alert  and oriented  Airway & Oxygen Therapy: Patient Spontanous Breathing  Post-op Assessment: Report given to PACU RN and Post -op Vital signs reviewed and stable  Post vital signs: Reviewed and stable  Complications: No apparent anesthesia complications

## 2012-12-04 NOTE — Op Note (Signed)
NAME:  Julie [name], Julie [name]               ACCOUNT NO.:  [id number]  MEDICAL RECORD NO.:  [id number]  LOCATION:  9102                          FACILITY:  [facility]  PHYSICIAN:  Guy Sandifer. Henderson Cloud, M.D. DATE OF BIRTH:  Apr 27, 1981  DATE OF PROCEDURE:  12/04/2012 DATE OF DISCHARGE:                              OPERATIVE REPORT   PREOPERATIVE DIAGNOSES: 1. Intrauterine pregnancy at 38-1/7th weeks. 2. Previous uterine myomectomy. 3. Desires permanent sterilization.  POSTOPERATIVE DIAGNOSES: 1. Intrauterine pregnancy at 38-1/7th weeks. 2. Previous uterine myomectomy. 3. Desires permanent sterilization.  PROCEDURE: 1. Primary low transverse cesarean section. 2. Bilateral tubal ligation. 3. Biopsy of the uterine fundus.  SURGEON:  Guy Sandifer. Henderson Cloud, MD  ASSISTANT:  Mitchel Honour DO  ANESTHESIA:  Spinal.  ESTIMATED BLOOD LOSS:  750 mL.  FINDINGS:  Viable female infant, Apgars of 9 and 9 at 1 and 5 minutes respectively.  Arterial cord pH 7.14.  SPECIMENS:  Placenta to Pathology.  INDICATIONS AND CONSENT:  This patient is a 32 year old married Julie [name] female, G2, P1, who is status post robotic uterine myomectomy.  Cesarean section was recommended.  The patient also desires permanent sterilization.  Potential risks and complications have been reviewed preoperatively including, but not limited to, infection, organ damage, bleeding requiring transfusion of blood products with HIV and hepatitis acquisition, DVT, PE, pneumonia.  Permanence of the tubal ligation, alternatives, increased ectopic risk, and failure rate have been reviewed for the tubal ligation as well.  All questions has been answered and consent is signed on the chart.  PROCEDURE IN DETAIL:  The patient was taken to the operating room where she is identified.  Spinal anesthetic was placed per Anesthesiology and she was placed in a dorsal supine position with a 15 degree left lateral wedge.  Time-out was undertaken.  She was then  prepped.  Foley catheter was placed, and draped in sterile fashion per [facility] protocol. After testing for adequate spinal anesthesia, skin was entered through a Pfannenstiel incision and dissection was carried out in layers to the peritoneum which was incised and extended superiorly and inferiorly. Vesicouterine peritoneum was taken down cephalad laterally.  Bladder flap was developed.  The bladder blade was placed.  Uterus was incised in a low transverse manner and the uterine cavity was entered bluntly with a hemostat.  Clear fluid was noted.  Uterine incision was extended cephalolaterally with the fingers.  Vertex was delivered without difficulty.  Remainder of the baby was delivered.  A vigorous baby with a good cry and tone was noted.  Cord was clamped and cut and the baby was handed to awaiting pediatrics team.  Placenta was manually delivered and sent to Pathology.  Uterus was clean.  Uterus was closed in 2 running locking imbricating layers of 0 Monocryl suture which achieves good hemostasis.  There are numerous subserosal 1-3 cm uterine leiomyoma noted on the fundus.  When manipulating the uterine fundus to expose the left fallopian tube, a small piece of tissue is partially avulsed. Inspection reveals it to be probable scar tissue secondary to history of myomectomy.  The remainder of the tissue was removed with cautery and sent to Pathology.  Hemostasis was obtained on the  peritoneum at that location with cautery.  The left fallopian tube was then identified from cornu to fimbria.  It was grasped in its mid ampullary portion with a Babcock clamp.  A knuckle of tube was then doubly ligated with 2 free ties of plain suture.  The intervening tube was then sharply resected with Metzenbaum scissors.  Cautery was used to assure complete hemostasis.  Similar procedure was carried out on the right side.  Both ovaries appeared normal.  Irrigation was carried out and now returns  as clear.  The anterior peritoneum was then closed in a running fashion with 0 Monocryl suture which was also used to reapproximate the pyramidalis muscle in midline.  Anterior rectus fascia was closed in a running fashion with a 0 looped PDS suture.  Subcutaneous tissues were reapproximated with interrupted plain suture and the skin was closed with a subcuticular Vicryl on a Keith needle.  Steri-Strips and dressings were applied.  All counts correct.  The patient is taken to recovery room in stable condition.     Guy Sandifer Henderson Cloud, M.D.     JET/MEDQ  D:  12/04/2012  T:  12/04/2012  Job:  [id number]

## 2012-12-05 ENCOUNTER — Encounter (HOSPITAL_COMMUNITY): Payer: Self-pay | Admitting: Obstetrics and Gynecology

## 2012-12-05 LAB — CBC
HCT: 32.6 % — ABNORMAL LOW (ref 36.0–46.0)
Hemoglobin: 10.7 g/dL — ABNORMAL LOW (ref 12.0–15.0)
MCH: 29.2 pg (ref 26.0–34.0)
MCHC: 32.8 g/dL (ref 30.0–36.0)
MCV: 88.8 fL (ref 78.0–100.0)
RBC: 3.67 MIL/uL — ABNORMAL LOW (ref 3.87–5.11)

## 2012-12-05 LAB — BIRTH TISSUE RECOVERY COLLECTION (PLACENTA DONATION)

## 2012-12-05 NOTE — Progress Notes (Signed)
Subjective: Postpartum Day 1: Cesarean Delivery Patient reports tolerating PO and no problems voiding.  Baby in NICU, grunting, ? Amniotic fluid aspiration  Objective: Vital signs in last 24 hours: Temp:  [97.6 F (36.4 C)-98.5 F (36.9 C)] 98 F (36.7 C) (07/24 0433) Pulse Rate:  [61-92] 85 (07/24 0433) Resp:  [16-30] 16 (07/24 0433) BP: (110-157)/(67-93) 136/77 mmHg (07/24 0433) SpO2:  [96 %-100 %] 97 % (07/24 0433) Weight:  [204 lb (92.534 kg)] 204 lb (92.534 kg) (07/23 1700)  Physical Exam:  General: alert and cooperative Lochia: appropriate Uterine Fundus: firm Incision: honeycomb dressing noted with small old drainage noted on bandage DVT Evaluation: No evidence of DVT seen on physical exam. Negative Homan's sign. No cords or calf tenderness. No significant calf/ankle edema.   Recent Labs  12/04/12 1128 12/05/12 0635  HGB 11.7* 10.7*  HCT 34.9* 32.6*    Assessment/Plan: Status post Cesarean section. Doing well postoperatively.  Cbc in am.  Lyriq Finerty G 12/05/2012, 7:58 AM

## 2012-12-05 NOTE — Progress Notes (Signed)
12/05/12 1400  Clinical Encounter Type  Visited With Patient and family together (husband Trey Paula, mom Marylu Lund)  Visit Type Initial;Spiritual support;Social support  Referral From Nurse (Lanora Manis, RN, NICU director)  Spiritual Encounters  Spiritual Needs Emotional  Stress Factors  Patient Stress Factors Loss of control (unexpected NICU stay/concerns for baby's health)  Family Stress Factors Loss of control (unexpected NICU stay/concerns for baby's health)   Visited with Kaile ("Squeaky" to family and friends), husband Trey Paula, and mom Marylu Lund.  Getting a shower, beginning to adapt to baby Giles's NICU needs, familiarity with NICU (Sophie, who turns 4 next month, stayed in our NICU for 2 weeks), good support (from family, friends, church), and prayer chain at church are all helping Rejoice and family cope with baby's unexpected NICU admission.  They welcomed pastoral check-in and are aware of ongoing chaplain availability. I provided pastoral presence, listening, encouragement, and info about spiritual care availability.  Will refer to [facility] for follow-up support.  [address] [place], [place] [phone]

## 2012-12-05 NOTE — Anesthesia Postprocedure Evaluation (Signed)
Anesthesia Post Note  Patient: Julie [name]  Procedure(s) Performed: Procedure(s) (LRB): PRIMARY CESAREAN SECTION WITH BILATERAL TUBAL LIGATION (Bilateral)  Anesthesia type: SAB  Patient location: Mother/Baby  Post pain: Pain level controlled  Post assessment: Post-op Vital signs reviewed  Last Vitals:  Filed Vitals:   12/05/12 0759  BP: 110/72  Pulse: 84  Temp: 36.7 C  Resp: 18    Post vital signs: Reviewed  Level of consciousness: awake  Complications: No apparent anesthesia complications. Patient c/o right thumb numbness. Described similar symptoms during pregnancy. Reassured unrelated to SAB. Reviewed with Dr. Arby Barrette. In agreement.

## 2012-12-06 ENCOUNTER — Encounter (HOSPITAL_COMMUNITY): Payer: Self-pay | Admitting: *Deleted

## 2012-12-06 LAB — CBC
HCT: 30.3 % — ABNORMAL LOW (ref 36.0–46.0)
Hemoglobin: 10 g/dL — ABNORMAL LOW (ref 12.0–15.0)
MCHC: 33 g/dL (ref 30.0–36.0)
MCV: 90.2 fL (ref 78.0–100.0)

## 2012-12-06 MED ORDER — BISACODYL 10 MG RE SUPP: 10.0000 mg | Freq: Every day | RECTAL | Status: DC | PRN

## 2012-12-06 NOTE — Lactation Note (Signed)
This note was copied from the chart of Julie Aleese Kamps. Lactation Consultation Note  Follow up consult with this mom fo a NICU baby, now 48 hours post partum. Mom has been hand expressing and not pumping, due to sore nipples. Mom reports no skin breakdown, and no discomfort in between pumping. Her nipples are tight in the 27 flange, and she has had suction "high". I gave her 30 flanges, and told her to limit sucition to a comfortable level. Mom had a DEP at home. I will follow this fmaily in the NICU.  Patient Name: Julie [name] [id number] Date: 12/06/2012 Reason for consult: Follow-up assessment;NICU baby   Maternal Data    Feeding    LATCH Score/Interventions          Comfort (Breast/Nipple): Filling, red/small blisters or bruises, mild/mod discomfort  Problem noted: Mild/Moderate discomfort        Lactation Tools Discussed/Used Tools: Flanges Flange Size: 30   Consult Status Consult Status: Follow-up Date: 12/07/12 Follow-up type: In-patient    Alfred Levins 12/06/2012, 2:24 PM

## 2012-12-06 NOTE — Lactation Note (Signed)
This note was copied from the chart of Julie Juleen Sorrels. Lactation Consultation Note  Initial consult with this mom of a term baby , in NICU with PPHN, now about 24 hours post partum. Mom has been pumping and hand expressing good amounts of colsosrum. I gave her and reviewed the NICU teaching booklet on pumping and providing breast milk of r aNICU baby. Mom and dad seem to be coping appropriately. I will follow this family in the NICU.  Patient Name: Julie [name] [id number] Date: 12/06/2012     Maternal Data    Feeding    LATCH Score/Interventions                      Lactation Tools Discussed/Used     Consult Status      Julie [name] 12/06/2012, 2:19 PM

## 2012-12-06 NOTE — Progress Notes (Signed)
   Family was feeling optimistic and hopeful today and they are taking things one day at a time.  They know from their previous experience in the NICU that things may be up and down for awhile.  They have good family support.  We will continue to follow up with them as we see them in the NICU, but please page as needs arise.  [address] [place] Pager, [phone] 3:54 PM   12/06/12 1500  Clinical Encounter Type  Visited With Family  Visit Type Spiritual support  Referral From Care management

## 2012-12-06 NOTE — Progress Notes (Signed)
Subjective: Postpartum Day 2: Cesarean Delivery Patient reports tolerating PO and no problems voiding.  Baby continues in NICU, on vent, pulmonary hypertension Patient complains of flatus, has not passed, request laxative Objective: Vital signs in last 24 hours: Temp:  [98 F (36.7 C)-99.2 F (37.3 C)] 98.5 F (36.9 C) (07/25 0515) Pulse Rate:  [72-99] 82 (07/25 0515) Resp:  [18-20] 18 (07/25 0515) BP: (110-128)/(70-82) 128/79 mmHg (07/25 0515) SpO2:  [96 %-98 %] 97 % (07/24 1600)  Physical Exam:  General: alert and cooperative Lochia: appropriate Uterine Fundus: firm Incision: honeycomb dressing CDI DVT Evaluation: No evidence of DVT seen on physical exam. Negative Homan's sign. No cords or calf tenderness. Calf/Ankle edema is present.   Recent Labs  12/05/12 0635 12/06/12 0610  HGB 10.7* 10.0*  HCT 32.6* 30.3*    Assessment/Plan: Status post Cesarean section. Doing well postoperatively.  Continue current care.  Shelena Castelluccio G 12/06/2012, 7:55 AM

## 2012-12-06 NOTE — Progress Notes (Signed)
CSW attempted to meet with MOB to complete assessment due to NICU admission, but she had numerous visitors at this time.  CSW briefly introduced support services offered by NICU CSW and gave contact information.  No concerns noted  From chart review and MOB states she and baby are well at this time.  CSW asked her to please contact CSW if she has any questions or needs while baby is in the NICU.  She was very pleasant and she agreed.  MOB looks familiar to CSW and she replied that she had a baby in the NICU in 2010. 

## 2012-12-07 LAB — CBC
MCH: 29.6 pg (ref 26.0–34.0)
MCHC: 32.8 g/dL (ref 30.0–36.0)
MCV: 90.3 fL (ref 78.0–100.0)
Platelets: 140 10*3/uL — ABNORMAL LOW (ref 150–400)
RBC: 3.51 MIL/uL — ABNORMAL LOW (ref 3.87–5.11)

## 2012-12-07 MED ORDER — OXYCODONE-ACETAMINOPHEN 5-325 MG PO TABS
1.0000 | ORAL_TABLET | ORAL | Status: DC | PRN
Start: 2012-12-07 — End: 2015-09-04

## 2012-12-07 MED ORDER — IBUPROFEN 800 MG PO TABS: 600.0000 mg | ORAL_TABLET | Freq: Three times a day (TID) | ORAL | Status: DC | PRN

## 2012-12-07 NOTE — Progress Notes (Signed)
Subjective: Postpartum Day 3: Cesarean Delivery Patient reports tolerating PO.    Objective: Vital signs in last 24 hours: Temp:  [97.5 F (36.4 C)-98 F (36.7 C)] 97.5 F (36.4 C) (07/26 0529) Pulse Rate:  [87-94] 87 (07/26 0529) Resp:  [18] 18 (07/26 0529) BP: (121-130)/(80-82) 121/80 mmHg (07/26 0529)  Physical Exam:  General: alert Lochia: appropriate Uterine Fundus: firm Incision: healing well DVT Evaluation: No evidence of DVT seen on physical exam.   Recent Labs  12/06/12 0610 12/07/12 0645  HGB 10.0* 10.4*  HCT 30.3* 31.7*    Assessment/Plan: Status post Cesarean section. Doing well postoperatively.  DCRicharda Overlie M 12/07/2012, 9:31 AM

## 2012-12-07 NOTE — Discharge Summary (Signed)
Obstetric Discharge Summary Reason for Admission: cesarean section Prenatal Procedures: none Intrapartum Procedures: cesarean: low cervical, transverse and tubal ligation Postpartum Procedures: none Complications-Operative and Postpartum: none Hemoglobin  Date Value Range Status  12/07/2012 10.4* 12.0 - 15.0 g/dL Final     HCT  Date Value Range Status  12/07/2012 31.7* 36.0 - 46.0 % Final    Physical Exam:  General: alert Lochia: appropriate Uterine Fundus: firm Incision: healing well DVT Evaluation: No evidence of DVT seen on physical exam.  Discharge Diagnoses: Term Pregnancy-delivered  Discharge Information: Date: 12/07/2012 Activity: pelvic rest Diet: routine Medications: PNV, Ibuprofen and Percocet Condition: stable Instructions: refer to practice specific booklet Discharge to: home Follow-up Information   Follow up with Meriel Pica, MD In 1 week.   Contact information:   [address] [place] [address] [phone]       Newborn Data: Live born female  Birth Weight: 9 lb 0.3 oz (4090 g) APGAR: 9, 9  Stable in NICU   [facility] M 12/07/2012, 9:33 AM

## 2012-12-09 ENCOUNTER — Other Ambulatory Visit (HOSPITAL_COMMUNITY): Payer: BC Managed Care – PPO

## 2012-12-17 ENCOUNTER — Inpatient Hospital Stay (HOSPITAL_COMMUNITY): Admission: AD | Admit: 2012-12-17 | Payer: Self-pay | Source: Ambulatory Visit | Admitting: Obstetrics and Gynecology

## 2012-12-18 ENCOUNTER — Ambulatory Visit: Payer: Self-pay

## 2012-12-18 NOTE — Lactation Note (Signed)
This note was copied from the chart of Julie Veryl Abril. Lactation Consultation Note   Follow up consult with this mom and baby, now 16 weeks old, and doing well. He is term, and feeding ALD. I assisted mom with cross cradle hold and then football hold(mom p[refers). The baby tends to start out well, then pulls back, stretching mom's nipple in his mouth, pulls his bottom lip in, and then gets sleepy. He is capable of transferring when he stays latched - audible swallows heard with strong, rhythmic  Suckles. Mom pumped after breast feeding, and was able to express 50 mls - and she usually expressed about 60 mls. I told mom we will do a pre and post with his next feed, and use the nipple shield earlier in the feed, as needed. Mom is fine with this plan.  Patient Name: Julie [name] [id number] Date: 12/18/2012 Reason for consult: Follow-up assessment;NICU baby   Maternal Data    Feeding Feeding Type: Breast Milk Length of feed: 40 min  LATCH Score/Interventions Latch: Repeated attempts needed to sustain latch, nipple held in mouth throughout feeding, stimulation needed to elicit sucking reflex. (20 nipple shiled used at 30 minutes into the feed) Intervention(s): Waking techniques;Skin to skin Intervention(s): Adjust position;Assist with latch;Breast massage;Breast compression  Audible Swallowing: Spontaneous and intermittent Intervention(s): Hand expression  Type of Nipple: Everted at rest and after stimulation  Comfort (Breast/Nipple): Soft / non-tender     Hold (Positioning): Assistance needed to correctly position infant at breast and maintain latch. Intervention(s): Breastfeeding basics reviewed;Support Pillows;Position options;Skin to skin  LATCH Score: 8  Lactation Tools Discussed/Used Tools: Nipple Shields Nipple shield size: 20   Consult Status Consult Status: Follow-up Date: 12/18/12    Alfred Levins 12/18/2012, 1:20 PM

## 2012-12-20 ENCOUNTER — Ambulatory Visit: Payer: Self-pay

## 2012-12-20 NOTE — Lactation Note (Signed)
This note was copied from the chart of Julie [name]. Lactation Consultation Note Mom and dad at bedside in NICU anticipating discharge today; mom holding baby. Offered to assist with a feeding, mom states she is not planning to breast feed baby today before they leave hospital, due to baby getting stressed at the breast, mom wants to wait until she gets home to breast feed. Questions answered.   Enc mom to call lactation office if she has any concerns, especially in terms of latching baby to breast, and enc mom to attend the BFSG.  Patient Name: Julie [name] [id number] Date: 12/20/2012 Reason for consult: Follow-up assessment;NICU baby   Maternal Data    Feeding    LATCH Score/Interventions                      Lactation Tools Discussed/Used     Consult Status Consult Status: PRN    Lenard Forth 12/20/2012, 1:58 PM

## 2014-01-28 ENCOUNTER — Other Ambulatory Visit: Payer: Self-pay | Admitting: Obstetrics and Gynecology

## 2014-01-29 LAB — CYTOLOGY - PAP

## 2014-02-10 ENCOUNTER — Other Ambulatory Visit: Payer: Self-pay | Admitting: Obstetrics and Gynecology

## 2014-03-16 ENCOUNTER — Encounter (HOSPITAL_COMMUNITY): Payer: Self-pay | Admitting: *Deleted

## 2015-02-01 ENCOUNTER — Other Ambulatory Visit: Payer: Self-pay | Admitting: Obstetrics and Gynecology

## 2015-02-02 LAB — CYTOLOGY - PAP

## 2015-09-04 ENCOUNTER — Emergency Department (HOSPITAL_COMMUNITY): Payer: BLUE CROSS/BLUE SHIELD

## 2015-09-04 ENCOUNTER — Encounter (HOSPITAL_COMMUNITY): Payer: Self-pay | Admitting: Emergency Medicine

## 2015-09-04 ENCOUNTER — Emergency Department (HOSPITAL_COMMUNITY)
Admission: EM | Admit: 2015-09-04 | Discharge: 2015-09-04 | Disposition: A | Discharge status: Home / Self Care | Payer: BLUE CROSS/BLUE SHIELD | Attending: Emergency Medicine | Admitting: Emergency Medicine

## 2015-09-04 DIAGNOSIS — Y999 Unspecified external cause status: Secondary | ICD-10-CM | POA: Diagnosis not present

## 2015-09-04 DIAGNOSIS — W010XXA Fall on same level from slipping, tripping and stumbling without subsequent striking against object, initial encounter: Secondary | ICD-10-CM | POA: Insufficient documentation

## 2015-09-04 DIAGNOSIS — S52501A Unspecified fracture of the lower end of right radius, initial encounter for closed fracture: Secondary | ICD-10-CM | POA: Insufficient documentation

## 2015-09-04 DIAGNOSIS — S5291XA Unspecified fracture of right forearm, initial encounter for closed fracture: Secondary | ICD-10-CM

## 2015-09-04 DIAGNOSIS — Y9389 Activity, other specified: Secondary | ICD-10-CM | POA: Diagnosis not present

## 2015-09-04 DIAGNOSIS — Y929 Unspecified place or not applicable: Secondary | ICD-10-CM | POA: Insufficient documentation

## 2015-09-04 DIAGNOSIS — S6991XA Unspecified injury of right wrist, hand and finger(s), initial encounter: Secondary | ICD-10-CM | POA: Diagnosis present

## 2015-09-04 MED ORDER — HYDROMORPHONE HCL 1 MG/ML IJ SOLN
1.0000 mg | Freq: Once | INTRAMUSCULAR | Status: AC
  Administered 2015-09-04: 1 mg via INTRAMUSCULAR

## 2015-09-04 MED ORDER — MELOXICAM 15 MG PO TABS: 15.0000 mg | ORAL_TABLET | Freq: Every day | ORAL | Status: DC

## 2015-09-04 MED ORDER — HYDROMORPHONE HCL 1 MG/ML IJ SOLN
1.0000 mg | Freq: Once | INTRAMUSCULAR | Status: DC
  Filled 2015-09-04: qty 1

## 2015-09-04 MED ORDER — OXYCODONE-ACETAMINOPHEN 5-325 MG PO TABS: 1.0000 | ORAL_TABLET | Freq: Four times a day (QID) | ORAL | Status: DC | PRN

## 2015-09-04 MED ORDER — PROMETHAZINE HCL 12.5 MG PO TABS
12.5000 mg | ORAL_TABLET | Freq: Once | ORAL | Status: AC
  Administered 2015-09-04: 12.5 mg via ORAL
  Filled 2015-09-04: qty 1

## 2015-09-04 NOTE — ED Provider Notes (Signed)
CSN: [id number]     Arrival date & time 09/04/15  1706 History   First MD Initiated Contact with Patient 09/04/15 1739     Chief Complaint  Patient presents with  . Fall     (Consider location/radiation/quality/duration/timing/severity/associated sxs/prior Treatment) HPI Comments: Patient is a 35 year old female who presents to the emergency department with a complaint of right wrist and hand pain.  The patient states that approximately 15-20 minutes prior to her arrival, she slipped on a wet surface with slick shoes and attempted to catch herself with her right hand. She developed an immediate pain and swelling involving the right hand and wrist. She denies any elbow or shoulder pain. She denies any other injury. She denies hitting her head. His been no previous operations or procedures involved concerning the right upper extremity. Nothing makes the pain better. Any movement or palpation makes the pain extremely worse.  The history is provided by the patient.    Past Medical History  Diagnosis Date  . Medical history non-contributory    Past Surgical History  Procedure Laterality Date  . Myomectomy  2012  . Cesarean section with bilateral tubal ligation Bilateral 12/04/2012    Procedure: PRIMARY CESAREAN SECTION WITH BILATERAL TUBAL LIGATION;  Surgeon: Allena Katz, MD;  Location: [facility];  Service: Obstetrics;  Laterality: Bilateral;  PRIMARY  edc 8/5   History reviewed. No pertinent family history. Social History  Substance Use Topics  . Smoking status: Never Smoker   . Smokeless tobacco: None  . Alcohol Use: No   OB History    Gravida Para Term Preterm AB TAB SAB Ectopic Multiple Living   2 2 1 1      2      Review of Systems  Constitutional: Negative for activity change.       All ROS Neg except as noted in HPI  HENT: Negative for nosebleeds.   Eyes: Negative for photophobia and discharge.  Respiratory: Negative for cough, shortness of breath and wheezing.    Cardiovascular: Negative for chest pain and palpitations.  Gastrointestinal: Negative for abdominal pain and blood in stool.  Genitourinary: Negative for dysuria, frequency and hematuria.  Musculoskeletal: Negative for back pain, arthralgias and neck pain.  Skin: Negative.   Neurological: Negative for dizziness, seizures and speech difficulty.  Psychiatric/Behavioral: Negative for hallucinations and confusion.  All other systems reviewed and are negative.     Allergies  Review of patient's allergies indicates no known allergies.  Home Medications   Prior to Admission medications   Medication Sig Start Date End Date Taking? Authorizing Provider  ibuprofen (ADVIL,MOTRIN) 800 MG tablet Take 1 tablet (800 mg total) by mouth every 8 (eight) hours as needed for pain. 12/07/12   Molli Posey, MD  meloxicam (MOBIC) 15 MG tablet Take 1 tablet (15 mg total) by mouth daily. 09/04/15   Lily Kocher, PA-C  oxyCODONE-acetaminophen (PERCOCET/ROXICET) 5-325 MG tablet Take 1 tablet by mouth every 6 (six) hours as needed. 09/04/15   Lily Kocher, PA-C  Prenatal Vit-Fe Fumarate-FA (PRENATAL MULTIVITAMIN) TABS Take 1 tablet by mouth daily at 12 noon.    Historical Provider, MD   BP 166/83 mmHg  Pulse 104  Temp(Src) 99.1 F (37.3 C) (Temporal)  Resp 18  Ht 5\' 3"  (1.6 m)  Wt 74.844 kg  BMI 29.24 kg/m2  SpO2 100%  LMP 08/05/2015 Physical Exam  Constitutional: She is oriented to person, place, and time. She appears well-developed and well-nourished.  Non-toxic appearance.  HENT:  Head:  Normocephalic.  Right Ear: Tympanic membrane and external ear normal.  Left Ear: Tympanic membrane and external ear normal.  Eyes: EOM and lids are normal. Pupils are equal, round, and reactive to light.  Neck: Normal range of motion. Neck supple. Carotid bruit is not present.  Cardiovascular: Normal rate, regular rhythm, normal heart sounds, intact distal pulses and normal pulses.   Pulmonary/Chest: Breath  sounds normal. No respiratory distress.  Abdominal: Soft. Bowel sounds are normal. There is no tenderness. There is no guarding.  Musculoskeletal: Normal range of motion.  There is a moderate size hematoma of the dorsum of the right wrist and hand. The capillary refill is less than 2 seconds. There is good movement of the fingers, but with pain. There is deformity of the right wrist, and pain with even light touch. Severe pain with any attempted movement. No effusion of the elbow. No deformity of the elbow or the right shoulder. Clavicle and scapula intact. Radial pulse 2+ bilat.  FROM of the right and left lower extremities.  Lymphadenopathy:       Head (right side): No submandibular adenopathy present.       Head (left side): No submandibular adenopathy present.    She has no cervical adenopathy.  Neurological: She is alert and oriented to person, place, and time. She has normal strength. No cranial nerve deficit or sensory deficit.  Skin: Skin is warm and dry.  Psychiatric: She has a normal mood and affect. Her speech is normal.  Nursing note and vitals reviewed.   ED Course FRACTURE CARE RIGHT WRIST. Patient sustained a fall down some stairs wet surface and slick shoes. She sustained a fracture of the right radius. I discussed the fracture with the patient in terms which he understands. Also discussed the procedure in terms the patient understands. The patient gives permission for the procedure.  The patient is identified by arm band. The patient is fitted with a sugar tong splint and sling. Following the procedure, the capillary refill is less than 2 seconds. There no temperature changes of the right upper extremity. The patient was treated for pain with intramuscular Dilaudid with significant improvement in the pain. The patient tolerated the procedure without problem.    Procedures (including critical care time) Labs Review Labs Reviewed - No data to display  Imaging Review Dg Wrist  Complete Right  09/04/2015  CLINICAL DATA:  Fall down stairs with right wrist pain and swelling, initial encounter EXAM: RIGHT WRIST - COMPLETE 3+ VIEW COMPARISON:  None. FINDINGS: There is a an undisplaced distal right radial fracture identified. This is best seen on the lateral projection. No ulnar fracture or carpal bone abnormality is noted. IMPRESSION: Undisplaced distal radial fracture Electronically Signed   By: Inez Catalina M.D.   On: 09/04/2015 17:44   I have personally reviewed and evaluated these images and lab results as part of my medical decision-making.   EKG Interpretation None      MDM  Vital signs reviewed. The pulse pulse rate is elevated. After questioning, the patient states that she has been having some fast heart rate, and sensation of palpitation from time to time. She has not taken the time to see a primary physician to have this evaluated. There no carotid bruits noted. There no irregular beats appreciated.  The patient sustained a fracture of the radius on the right. Patient was fitted with a sugar tong splint sling and ice pack. The patient will be referred to hand surgery for additional evaluation  and management. Prescription form OB one daily, and Percocet one every 6 hours given to the patient. The patient is in agreement with this discharge plan.    Final diagnoses:  Radial fracture, right, closed, initial encounter    **I have reviewed nursing notes, vital signs, and all appropriate lab and imaging results for this patient.Lily Kocher, PA-C 09/04/15 [place], DO 09/06/15 1659

## 2015-09-04 NOTE — Discharge Instructions (Signed)
Your x-ray reveals a fracture of the radius on the right. Please keep your right wrist above your heart is much as possible. Apply ice. Use your sling when up and about. Please use mole back daily for inflammation. Use Tylenol for mild pain, use Percocet for more severe pain. Percocet may cause drowsiness, and/or constipation. It may be helpful to use a stool softener while taking this medication. Please do not drink alcohol, operate machinery, or participate in activities requiring concentration when taking the Percocet medication. Please see Dr. Caprice Red, or the orthopedic specialist of your choice as soon as possible to evaluate your fracture.

## 2015-09-04 NOTE — ED Notes (Signed)
Pt states she slipped about 15 minutes ago and caught herself with her right hand.  C/o right wrist pain.

## 2017-03-16 NOTE — Patient Instructions (Addendum)
Your procedure is scheduled on: Thursday March 29, 2017 at 7:30 am  Enter through the [employer] [facility] at: 6:00 am  Pick up the phone at the desk and dial [phone].  Call this number if you have problems the morning of surgery: [phone].  Remember: Do NOT eat food or drink any liquids after: Midnight on Wednesday November 14 Do NOT drink clear liquids after: Take these medicines the morning of surgery with a SIP OF WATER: None  STOP ALL VITAMINS AND SUPPLEMENT 1 WEEK PRIOR TO SURGERY  Do NOT wear jewelry (body piercing), metal hair clips/bobby pins, make-up, or nail polish. Do NOT wear lotions, powders, or perfumes.  You may wear deoderant. Do NOT shave for 48 hours prior to surgery. Do NOT bring valuables to the hospital. Contacts, dentures, or bridgework may not be worn into surgery.  Leave suitcase in car.  After surgery it may be brought to your room.  For patients admitted to the hospital, checkout time is 11:00 AM the day of discharge.

## 2017-03-21 ENCOUNTER — Encounter (HOSPITAL_COMMUNITY): Payer: Self-pay

## 2017-03-21 ENCOUNTER — Encounter (HOSPITAL_COMMUNITY)
Admission: RE | Admit: 2017-03-21 | Discharge: 2017-03-21 | Disposition: A | Discharge status: Home / Self Care | Payer: BLUE CROSS/BLUE SHIELD | Source: Ambulatory Visit | Attending: Obstetrics and Gynecology | Admitting: Obstetrics and Gynecology

## 2017-03-21 ENCOUNTER — Other Ambulatory Visit: Payer: Self-pay

## 2017-03-21 DIAGNOSIS — N920 Excessive and frequent menstruation with regular cycle: Secondary | ICD-10-CM | POA: Diagnosis not present

## 2017-03-21 HISTORY — DX: Unspecified asthma, uncomplicated: J45.909

## 2017-03-21 HISTORY — DX: Disease of blood and blood-forming organs, unspecified: D75.9

## 2017-03-21 LAB — CBC
HEMATOCRIT: 39 % (ref 36.0–46.0)
Hemoglobin: 13.2 g/dL (ref 12.0–15.0)
MCH: 29.5 pg (ref 26.0–34.0)
MCHC: 33.8 g/dL (ref 30.0–36.0)
MCV: 87.1 fL (ref 78.0–100.0)
Platelets: 204 10*3/uL (ref 150–400)
RBC: 4.48 MIL/uL (ref 3.87–5.11)
RDW: 13.6 % (ref 11.5–15.5)
WBC: 6 10*3/uL (ref 4.0–10.5)

## 2017-03-21 LAB — TYPE AND SCREEN
ABO/RH(D): O POS
ANTIBODY SCREEN: NEGATIVE

## 2017-03-28 NOTE — H&P (Signed)
Julie [name] is an 36 y.o. female with heavy painful menses. U/S in office 03/19/17 = uterus 11.4 x 6.2 x 8.0 cm, no adnexal masses, normal masses. EMB benign.  Pertinent Gynecological History: Menses: flow is excessive with use of many pads or tampons on heaviest days Bleeding: dysfunctional uterine bleeding Contraception: tubal ligation DES exposure: denies Blood transfusions: none Sexually transmitted diseases: no past history Previous GYN Procedures: Robotic Uterine Myomectomy  Last mammogram: N/A Date: N/A Last pap: normal Date: 2018 OB History: G2, P2   Menstrual History: Menarche age: unknown No LMP recorded.    Past Medical History:  Diagnosis Date  . Asthma    as a child  . Blood dyscrasia    bleeds easily  . Medical history non-contributory     Past Surgical History:  Procedure Laterality Date  . MYOMECTOMY  2012  . TONSILLECTOMY    . TUBAL LIGATION     during C/S    No family history on file.  Social History:  reports that  has never smoked. she has never used smokeless tobacco. She reports that she does not drink alcohol or use drugs.  Allergies:  Allergies  Allergen Reactions  . Other     BAND-AID ADHESIVE (RASH)    No medications prior to admission.    Review of Systems  Constitutional: Negative for fever.    unknown if currently breastfeeding. Physical Exam  Cardiovascular: Normal rate and regular rhythm.  Respiratory: Effort normal and breath sounds normal.  GI: Soft. Bowel sounds are normal.  Genitourinary:  Genitourinary Comments: Uterus 10 weeks size No palpable adnexal masses    No results found for this or any previous visit (from the past 24 hour(s)).  No results found.  Assessment/Plan: 36 yo G2P2 with menorrhagia and dysmenorrhea D/W patient LAVH/BS, possible TAH/BS Risks reviewed including infection, organ damage, bleeding/transfusion-HIV/Hep, DVT/PE, pneumonia, pain, return to [place] 03/28/2017,  6:01 PM

## 2017-03-29 ENCOUNTER — Other Ambulatory Visit: Payer: Self-pay

## 2017-03-29 ENCOUNTER — Ambulatory Visit (HOSPITAL_COMMUNITY): Payer: BLUE CROSS/BLUE SHIELD | Admitting: Anesthesiology

## 2017-03-29 ENCOUNTER — Observation Stay (HOSPITAL_COMMUNITY)
Admission: AD | Admit: 2017-03-29 | Discharge: 2017-03-30 | Disposition: A | Discharge status: Home / Self Care | Payer: BLUE CROSS/BLUE SHIELD | Source: Ambulatory Visit | Attending: Obstetrics and Gynecology | Admitting: Obstetrics and Gynecology

## 2017-03-29 ENCOUNTER — Encounter (HOSPITAL_COMMUNITY)
Admission: AD | Disposition: A | Discharge status: Home / Self Care | Payer: Self-pay | Source: Ambulatory Visit | Attending: Obstetrics and Gynecology

## 2017-03-29 ENCOUNTER — Encounter (HOSPITAL_COMMUNITY): Payer: Self-pay

## 2017-03-29 DIAGNOSIS — N838 Other noninflammatory disorders of ovary, fallopian tube and broad ligament: Secondary | ICD-10-CM | POA: Insufficient documentation

## 2017-03-29 DIAGNOSIS — N920 Excessive and frequent menstruation with regular cycle: Secondary | ICD-10-CM | POA: Diagnosis present

## 2017-03-29 DIAGNOSIS — D259 Leiomyoma of uterus, unspecified: Principal | ICD-10-CM | POA: Insufficient documentation

## 2017-03-29 HISTORY — PX: LAPAROSCOPIC VAGINAL HYSTERECTOMY WITH SALPINGECTOMY: SHX6680

## 2017-03-29 SURGERY — HYSTERECTOMY, VAGINAL, LAPAROSCOPY-ASSISTED, WITH SALPINGECTOMY
Anesthesia: General | Site: Abdomen | Laterality: Bilateral

## 2017-03-29 MED ORDER — SODIUM CHLORIDE 0.9 % IJ SOLN
INTRAMUSCULAR | Status: DC | PRN
  Administered 2017-03-29: 10 mL

## 2017-03-29 MED ORDER — MIDAZOLAM HCL 2 MG/2ML IJ SOLN
INTRAMUSCULAR | Status: AC
  Filled 2017-03-29: qty 2

## 2017-03-29 MED ORDER — DEXAMETHASONE SODIUM PHOSPHATE 10 MG/ML IJ SOLN
INTRAMUSCULAR | Status: AC
  Filled 2017-03-29: qty 1

## 2017-03-29 MED ORDER — BUPIVACAINE HCL (PF) 0.5 % IJ SOLN
INTRAMUSCULAR | Status: DC | PRN
  Administered 2017-03-29: 30 mL

## 2017-03-29 MED ORDER — CEFAZOLIN SODIUM-DEXTROSE 2-3 GM-%(50ML) IV SOLR
INTRAVENOUS | Status: AC
  Filled 2017-03-29: qty 50

## 2017-03-29 MED ORDER — SUGAMMADEX SODIUM 200 MG/2ML IV SOLN
INTRAVENOUS | Status: AC
  Filled 2017-03-29: qty 2

## 2017-03-29 MED ORDER — PROPOFOL 10 MG/ML IV BOLUS
INTRAVENOUS | Status: DC | PRN
  Administered 2017-03-29: 160 mg via INTRAVENOUS

## 2017-03-29 MED ORDER — METOCLOPRAMIDE HCL 5 MG/ML IJ SOLN: 10.0000 mg | Freq: Once | INTRAMUSCULAR | Status: DC | PRN

## 2017-03-29 MED ORDER — SUGAMMADEX SODIUM 200 MG/2ML IV SOLN
INTRAVENOUS | Status: DC | PRN
  Administered 2017-03-29: 180 mg via INTRAVENOUS

## 2017-03-29 MED ORDER — MEPERIDINE HCL 25 MG/ML IJ SOLN
INTRAMUSCULAR | Status: AC
  Filled 2017-03-29: qty 1

## 2017-03-29 MED ORDER — FENTANYL CITRATE (PF) 100 MCG/2ML IJ SOLN
INTRAMUSCULAR | Status: AC
  Administered 2017-03-29: 50 ug via INTRAVENOUS
  Filled 2017-03-29: qty 2

## 2017-03-29 MED ORDER — LACTATED RINGERS IV SOLN
INTRAVENOUS | Status: DC
  Administered 2017-03-29 (×2): via INTRAVENOUS

## 2017-03-29 MED ORDER — IBUPROFEN 600 MG PO TABS
600.0000 mg | ORAL_TABLET | Freq: Four times a day (QID) | ORAL | Status: DC | PRN
  Administered 2017-03-29 – 2017-03-30 (×2): 600 mg via ORAL
  Filled 2017-03-29 (×2): qty 1

## 2017-03-29 MED ORDER — HYDROMORPHONE HCL 1 MG/ML IJ SOLN: 0.2000 mg | INTRAMUSCULAR | Status: DC | PRN

## 2017-03-29 MED ORDER — SCOPOLAMINE 1 MG/3DAYS TD PT72
1.0000 | MEDICATED_PATCH | Freq: Once | TRANSDERMAL | Status: DC
  Administered 2017-03-29: 1.5 mg via TRANSDERMAL

## 2017-03-29 MED ORDER — ONDANSETRON HCL 4 MG/2ML IJ SOLN
INTRAMUSCULAR | Status: DC | PRN
  Administered 2017-03-29: 4 mg via INTRAVENOUS

## 2017-03-29 MED ORDER — ROCURONIUM BROMIDE 100 MG/10ML IV SOLN
INTRAVENOUS | Status: AC
  Filled 2017-03-29: qty 1

## 2017-03-29 MED ORDER — SCOPOLAMINE 1 MG/3DAYS TD PT72
MEDICATED_PATCH | TRANSDERMAL | Status: AC
  Administered 2017-03-29: 1.5 mg via TRANSDERMAL
  Filled 2017-03-29: qty 1

## 2017-03-29 MED ORDER — LIDOCAINE HCL (CARDIAC) 20 MG/ML IV SOLN
INTRAVENOUS | Status: AC
  Filled 2017-03-29: qty 5

## 2017-03-29 MED ORDER — SENNA 8.6 MG PO TABS
1.0000 | ORAL_TABLET | Freq: Two times a day (BID) | ORAL | Status: DC
  Administered 2017-03-29: 8.6 mg via ORAL
  Filled 2017-03-29 (×3): qty 1

## 2017-03-29 MED ORDER — SIMETHICONE 80 MG PO CHEW
80.0000 mg | CHEWABLE_TABLET | Freq: Four times a day (QID) | ORAL | Status: DC | PRN
  Administered 2017-03-29 (×2): 80 mg via ORAL
  Filled 2017-03-29 (×2): qty 1

## 2017-03-29 MED ORDER — LACTATED RINGERS IV SOLN: INTRAVENOUS | Status: DC

## 2017-03-29 MED ORDER — LIDOCAINE HCL (CARDIAC) 20 MG/ML IV SOLN
INTRAVENOUS | Status: DC | PRN
  Administered 2017-03-29: 100 mg via INTRAVENOUS

## 2017-03-29 MED ORDER — BUPIVACAINE HCL (PF) 0.5 % IJ SOLN
INTRAMUSCULAR | Status: AC
  Filled 2017-03-29: qty 30

## 2017-03-29 MED ORDER — MAGNESIUM HYDROXIDE 400 MG/5ML PO SUSP: 30.0000 mL | Freq: Every day | ORAL | Status: DC | PRN

## 2017-03-29 MED ORDER — SODIUM CHLORIDE 0.9 % IR SOLN
Status: DC | PRN
  Administered 2017-03-29: 3000 mL

## 2017-03-29 MED ORDER — ONDANSETRON HCL 4 MG PO TABS: 4.0000 mg | ORAL_TABLET | Freq: Four times a day (QID) | ORAL | Status: DC | PRN

## 2017-03-29 MED ORDER — ONDANSETRON HCL 4 MG/2ML IJ SOLN
INTRAMUSCULAR | Status: AC
  Filled 2017-03-29: qty 2

## 2017-03-29 MED ORDER — FENTANYL CITRATE (PF) 100 MCG/2ML IJ SOLN
25.0000 ug | INTRAMUSCULAR | Status: DC | PRN
  Administered 2017-03-29 (×2): 50 ug via INTRAVENOUS

## 2017-03-29 MED ORDER — CEFAZOLIN SODIUM-DEXTROSE 2-4 GM/100ML-% IV SOLN
2.0000 g | INTRAVENOUS | Status: AC
  Administered 2017-03-29: 2 g via INTRAVENOUS
  Filled 2017-03-29: qty 100

## 2017-03-29 MED ORDER — GLYCOPYRROLATE 0.2 MG/ML IJ SOLN
INTRAMUSCULAR | Status: DC | PRN
  Administered 2017-03-29 (×2): 0.1 mg via INTRAVENOUS

## 2017-03-29 MED ORDER — MENTHOL 3 MG MT LOZG: 1.0000 | LOZENGE | OROMUCOSAL | Status: DC | PRN

## 2017-03-29 MED ORDER — ONDANSETRON HCL 4 MG/2ML IJ SOLN: 4.0000 mg | Freq: Four times a day (QID) | INTRAMUSCULAR | Status: DC | PRN

## 2017-03-29 MED ORDER — FENTANYL CITRATE (PF) 250 MCG/5ML IJ SOLN
INTRAMUSCULAR | Status: AC
  Filled 2017-03-29: qty 5

## 2017-03-29 MED ORDER — OXYCODONE HCL 5 MG PO TABS
5.0000 mg | ORAL_TABLET | ORAL | Status: DC | PRN
  Administered 2017-03-29 – 2017-03-30 (×3): 5 mg via ORAL
  Filled 2017-03-29 (×3): qty 1

## 2017-03-29 MED ORDER — SODIUM CHLORIDE 0.9 % IJ SOLN
INTRAMUSCULAR | Status: AC
  Filled 2017-03-29: qty 10

## 2017-03-29 MED ORDER — GLYCOPYRROLATE 0.2 MG/ML IJ SOLN
INTRAMUSCULAR | Status: AC
  Filled 2017-03-29: qty 1

## 2017-03-29 MED ORDER — MEPERIDINE HCL 25 MG/ML IJ SOLN
6.2500 mg | INTRAMUSCULAR | Status: DC | PRN
  Administered 2017-03-29: 12.5 mg via INTRAVENOUS

## 2017-03-29 MED ORDER — ROCURONIUM BROMIDE 100 MG/10ML IV SOLN
INTRAVENOUS | Status: DC | PRN
  Administered 2017-03-29: 10 mg via INTRAVENOUS
  Administered 2017-03-29: 40 mg via INTRAVENOUS

## 2017-03-29 MED ORDER — FENTANYL CITRATE (PF) 100 MCG/2ML IJ SOLN
INTRAMUSCULAR | Status: DC | PRN
  Administered 2017-03-29 (×5): 50 ug via INTRAVENOUS

## 2017-03-29 MED ORDER — KETOROLAC TROMETHAMINE 30 MG/ML IJ SOLN
INTRAMUSCULAR | Status: DC | PRN
  Administered 2017-03-29: 30 mg via INTRAVENOUS

## 2017-03-29 MED ORDER — KETOROLAC TROMETHAMINE 30 MG/ML IJ SOLN
INTRAMUSCULAR | Status: AC
  Filled 2017-03-29: qty 1

## 2017-03-29 MED ORDER — DEXAMETHASONE SODIUM PHOSPHATE 10 MG/ML IJ SOLN
INTRAMUSCULAR | Status: DC | PRN
  Administered 2017-03-29: 8 mg via INTRAVENOUS

## 2017-03-29 MED ORDER — ZOLPIDEM TARTRATE 5 MG PO TABS: 5.0000 mg | ORAL_TABLET | Freq: Every evening | ORAL | Status: DC | PRN

## 2017-03-29 MED ORDER — MIDAZOLAM HCL 2 MG/2ML IJ SOLN
INTRAMUSCULAR | Status: DC | PRN
  Administered 2017-03-29: 1 mg via INTRAVENOUS

## 2017-03-29 MED ORDER — PROPOFOL 10 MG/ML IV BOLUS
INTRAVENOUS | Status: AC
  Filled 2017-03-29: qty 40

## 2017-03-29 SURGICAL SUPPLY — 51 items
ADH SKN CLS APL DERMABOND .7 (GAUZE/BANDAGES/DRESSINGS) ×1
CABLE HIGH FREQUENCY MONO STRZ (ELECTRODE) IMPLANT
CATH ROBINSON RED A/P 16FR (CATHETERS) ×3 IMPLANT
CLOTH BEACON ORANGE TIMEOUT ST (SAFETY) ×3 IMPLANT
CONT PATH 16OZ SNAP LID 3702 (MISCELLANEOUS) ×3 IMPLANT
COVER BACK TABLE 60X90IN (DRAPES) ×3 IMPLANT
DECANTER SPIKE VIAL GLASS SM (MISCELLANEOUS) ×6 IMPLANT
DERMABOND ADVANCED (GAUZE/BANDAGES/DRESSINGS) ×2
DERMABOND ADVANCED .7 DNX12 (GAUZE/BANDAGES/DRESSINGS) ×1 IMPLANT
DRSG OPSITE POSTOP 3X4 (GAUZE/BANDAGES/DRESSINGS) ×3 IMPLANT
DURAPREP 26ML APPLICATOR (WOUND CARE) ×3 IMPLANT
ELECT REM PT RETURN 9FT ADLT (ELECTROSURGICAL) ×3
ELECTRODE REM PT RTRN 9FT ADLT (ELECTROSURGICAL) ×1 IMPLANT
FILTER SMOKE EVAC LAPAROSHD (FILTER) ×3 IMPLANT
GLOVE BIO SURGEON STRL SZ7.5 (GLOVE) ×6 IMPLANT
GLOVE BIOGEL PI IND STRL 6.5 (GLOVE) ×1 IMPLANT
GLOVE BIOGEL PI IND STRL 7.0 (GLOVE) ×2 IMPLANT
GLOVE BIOGEL PI IND STRL 8 (GLOVE) ×1 IMPLANT
GLOVE BIOGEL PI INDICATOR 6.5 (GLOVE) ×2
GLOVE BIOGEL PI INDICATOR 7.0 (GLOVE) ×4
GLOVE BIOGEL PI INDICATOR 8 (GLOVE) ×2
GOWN STRL REUS W/ TWL XL LVL3 (GOWN DISPOSABLE) ×2 IMPLANT
GOWN STRL REUS W/TWL XL LVL3 (GOWN DISPOSABLE) ×6
LEGGING LITHOTOMY PAIR STRL (DRAPES) ×3 IMPLANT
LIGASURE IMPACT 36 18CM CVD LR (INSTRUMENTS) ×3 IMPLANT
NEEDLE INSUFFLATION 120MM (ENDOMECHANICALS) ×3 IMPLANT
NS IRRIG 1000ML POUR BTL (IV SOLUTION) ×3 IMPLANT
PACK LAVH (CUSTOM PROCEDURE TRAY) ×3 IMPLANT
PACK ROBOTIC GOWN (GOWN DISPOSABLE) ×3 IMPLANT
PACK TRENDGUARD 450 HYBRID PRO (MISCELLANEOUS) ×1 IMPLANT
PACK TRENDGUARD 600 HYBRD PROC (MISCELLANEOUS) IMPLANT
PROTECTOR NERVE ULNAR (MISCELLANEOUS) ×6 IMPLANT
SCISSORS LAP 5X45 EPIX DISP (ENDOMECHANICALS) IMPLANT
SEALER TISSUE G2 CVD JAW 45CM (ENDOMECHANICALS) ×3 IMPLANT
SET CYSTO W/LG BORE CLAMP LF (SET/KITS/TRAYS/PACK) IMPLANT
SET IRRIG TUBING LAPAROSCOPIC (IRRIGATION / IRRIGATOR) ×3 IMPLANT
SLEEVE XCEL OPT CAN 5 100 (ENDOMECHANICALS) IMPLANT
SUT MNCRL 0 MO-4 VIOLET 18 CR (SUTURE) ×2 IMPLANT
SUT MNCRL 0 VIOLET 6X18 (SUTURE) ×1 IMPLANT
SUT MONOCRYL 0 6X18 (SUTURE) ×2
SUT MONOCRYL 0 MO 4 18  CR/8 (SUTURE) ×4
SUT VIC AB 4-0 PS2 27 (SUTURE) ×3 IMPLANT
SUT VICRYL 0 UR6 27IN ABS (SUTURE) ×3 IMPLANT
SYR 30ML LL (SYRINGE) ×3 IMPLANT
TOWEL OR 17X24 6PK STRL BLUE (TOWEL DISPOSABLE) ×6 IMPLANT
TRAY FOLEY CATH SILVER 14FR (SET/KITS/TRAYS/PACK) ×3 IMPLANT
TRENDGUARD 450 HYBRID PRO PACK (MISCELLANEOUS) ×3
TRENDGUARD 600 HYBRID PROC PK (MISCELLANEOUS)
TROCAR XCEL NON-BLD 11X100MML (ENDOMECHANICALS) ×3 IMPLANT
TROCAR XCEL NON-BLD 5MMX100MML (ENDOMECHANICALS) ×3 IMPLANT
WARMER LAPAROSCOPE (MISCELLANEOUS) ×3 IMPLANT

## 2017-03-29 NOTE — Anesthesia Procedure Notes (Signed)
Procedure Name: Intubation Date/Time: 03/29/2017 7:25 AM Performed by: Georgeanne Nim, CRNA Pre-anesthesia Checklist: Patient identified, Emergency Drugs available, Suction available, Patient being monitored and Timeout performed Patient Re-evaluated:Patient Re-evaluated prior to induction Oxygen Delivery Method: Circle system utilized Preoxygenation: Pre-oxygenation with 100% oxygen Induction Type: IV induction Ventilation: Mask ventilation without difficulty Laryngoscope Size: Mac and 3 Grade View: Grade I Number of attempts: 1 Airway Equipment and Method: Stylet Placement Confirmation: ETT inserted through vocal cords under direct vision,  positive ETCO2,  breath sounds checked- equal and bilateral and CO2 detector Secured at: 21 cm Tube secured with: Tape Dental Injury: Teeth and Oropharynx as per pre-operative assessment

## 2017-03-29 NOTE — Progress Notes (Signed)
No changes to H&P per patient history Reviewed with patient procedure-LAVH/BS, possible TAH/BS. All questions answered. Patient states she understands and agrees

## 2017-03-29 NOTE — Progress Notes (Signed)
Good pain relief. Up to BR to void without difficulty - foley out. Tolerating regular diet   VSS Afeb Lungs CTA Cor RRR Abd soft, BS+  A/P: Stable         Per Orders

## 2017-03-29 NOTE — Brief Op Note (Signed)
03/29/2017  8:51 AM  PATIENT:  Julie [name]  36 y.o. female  PRE-OPERATIVE DIAGNOSIS:  menorrhagia  POST-OPERATIVE DIAGNOSIS:  menorrhagia  PROCEDURE:  Procedure(s): LAPAROSCOPIC ASSISTED VAGINAL HYSTERECTOMY WITH SALPINGECTOMY (Bilateral)  SURGEON:  Surgeon(s) and Role:    * Everlene Farrier, MD - Primary    * Arvella Nigh, MD - Assisting  PHYSICIAN ASSISTANT:   ASSISTANTS: none   ANESTHESIA:   general  EBL:  200 mL   BLOOD ADMINISTERED:none  DRAINS: Urinary Catheter (Foley)   LOCAL MEDICATIONS USED:  MARCAINE    and Amount: 30 ml  SPECIMEN:  Source of Specimen:  uterus, bilateral fallopian tubes  DISPOSITION OF SPECIMEN:  PATHOLOGY  COUNTS:  YES  TOURNIQUET:  * No tourniquets in log *  DICTATION: .Other Dictation: Dictation Number [id number]  PLAN OF CARE: Admit for overnight observation  PATIENT DISPOSITION:  PACU - hemodynamically stable.   Delay start of Pharmacological VTE agent (>24hrs) due to surgical blood loss or risk of bleeding: not applicable

## 2017-03-29 NOTE — Anesthesia Postprocedure Evaluation (Signed)
Anesthesia Post Note  Patient: Julie [name]  Procedure(s) Performed: LAPAROSCOPIC ASSISTED VAGINAL HYSTERECTOMY WITH SALPINGECTOMY (Bilateral Abdomen)     Patient location during evaluation: PACU Anesthesia Type: General Level of consciousness: awake and alert Pain management: pain level controlled Vital Signs Assessment: post-procedure vital signs reviewed and stable Respiratory status: spontaneous breathing, nonlabored ventilation, respiratory function stable and patient connected to nasal cannula oxygen Cardiovascular status: blood pressure returned to baseline and stable Postop Assessment: no apparent nausea or vomiting Anesthetic complications: no    Last Vitals:  Vitals:   03/29/17 1031 03/29/17 1135  BP: (!) 146/93 (!) 144/94  Pulse: 85 74  Resp: 15 16  Temp: 36.8 C 36.8 C  SpO2: 95% 98%    Last Pain:  Vitals:   03/29/17 1135  TempSrc: Oral  PainSc:    Pain Goal: Patients Stated Pain Goal: 3 (03/29/17 1042)               Montez Hageman

## 2017-03-29 NOTE — Transfer of Care (Signed)
Immediate Anesthesia Transfer of Care Note  Patient: Julie [name]  Procedure(s) Performed: LAPAROSCOPIC ASSISTED VAGINAL HYSTERECTOMY WITH SALPINGECTOMY (Bilateral Abdomen)  Patient Location: PACU  Anesthesia Type:General  Level of Consciousness: awake, alert , oriented and patient cooperative  Airway & Oxygen Therapy: Patient Spontanous Breathing and Patient connected to nasal cannula oxygen  Post-op Assessment: Report given to RN and Post -op Vital signs reviewed and stable  Post vital signs: Reviewed and stable  Last Vitals:  Vitals:   03/29/17 0559  BP: 128/89  Pulse: 85  Resp: 18  Temp: 36.6 C  SpO2: 97%    Last Pain:  Vitals:   03/29/17 0559  TempSrc: Oral      Patients Stated Pain Goal: 4 (25/85/27 7824)  Complications: No apparent anesthesia complications

## 2017-03-29 NOTE — Anesthesia Preprocedure Evaluation (Signed)
Anesthesia Evaluation  Patient identified by MRN, date of birth, ID band Patient awake    Reviewed: Allergy & Precautions, NPO status , Patient's Chart, lab work & pertinent test results  Airway Mallampati: II  TM Distance: >3 FB Neck ROM: Full    Dental no notable dental hx.    Pulmonary neg pulmonary ROS,    Pulmonary exam normal breath sounds clear to auscultation       Cardiovascular negative cardio ROS Normal cardiovascular exam Rhythm:Regular Rate:Normal     Neuro/Psych negative neurological ROS  negative psych ROS   GI/Hepatic negative GI ROS, Neg liver ROS,   Endo/Other  negative endocrine ROS  Renal/GU negative Renal ROS  negative genitourinary   Musculoskeletal negative musculoskeletal ROS (+)   Abdominal   Peds negative pediatric ROS (+)  Hematology negative hematology ROS (+)   Anesthesia Other Findings   Reproductive/Obstetrics negative OB ROS                             Anesthesia Physical Anesthesia Plan  ASA: II  Anesthesia Plan: General   Post-op Pain Management:    Induction: Intravenous  PONV Risk Score and Plan: 3 and Ondansetron, Dexamethasone and Midazolam  Airway Management Planned: Oral ETT  Additional Equipment:   Intra-op Plan:   Post-operative Plan: Extubation in OR  Informed Consent: I have reviewed the patients History and Physical, chart, labs and discussed the procedure including the risks, benefits and alternatives for the proposed anesthesia with the patient or authorized representative who has indicated his/her understanding and acceptance.   Dental advisory given  Plan Discussed with: CRNA  Anesthesia Plan Comments:         Anesthesia Quick Evaluation

## 2017-03-30 ENCOUNTER — Encounter (HOSPITAL_COMMUNITY): Payer: Self-pay | Admitting: Obstetrics and Gynecology

## 2017-03-30 ENCOUNTER — Other Ambulatory Visit: Payer: Self-pay

## 2017-03-30 DIAGNOSIS — D259 Leiomyoma of uterus, unspecified: Secondary | ICD-10-CM | POA: Diagnosis not present

## 2017-03-30 LAB — CBC
HEMATOCRIT: 39.5 % (ref 36.0–46.0)
HEMOGLOBIN: 13.1 g/dL (ref 12.0–15.0)
MCH: 29.8 pg (ref 26.0–34.0)
MCHC: 33.2 g/dL (ref 30.0–36.0)
MCV: 89.8 fL (ref 78.0–100.0)
Platelets: 229 10*3/uL (ref 150–400)
RBC: 4.4 MIL/uL (ref 3.87–5.11)
RDW: 13.3 % (ref 11.5–15.5)
WBC: 10.2 10*3/uL (ref 4.0–10.5)

## 2017-03-30 MED ORDER — IBUPROFEN 600 MG PO TABS: 600.0000 mg | ORAL_TABLET | Freq: Four times a day (QID) | ORAL | 0 refills | Status: AC | PRN

## 2017-03-30 MED ORDER — OXYCODONE HCL 5 MG PO TABS: 5.0000 mg | ORAL_TABLET | Freq: Four times a day (QID) | ORAL | 0 refills | Status: DC | PRN

## 2017-03-30 NOTE — Progress Notes (Signed)
Discharge instructions and script given to pt. Pt questions answered, pt states understanding. Signs and given copy

## 2017-03-30 NOTE — Progress Notes (Signed)
POD #1 Ambulating, tolerating regular diet, + flatus, good pain relief  VSS Afeb  Abdomen soft, BS+  Results for orders placed or performed during the hospital encounter of 03/29/17 (from the past 24 hour(s))  CBC     Status: None   Collection Time: 03/30/17  5:41 AM  Result Value Ref Range   WBC 10.2 4.0 - 10.5 K/uL   RBC 4.40 3.87 - 5.11 MIL/uL   Hemoglobin 13.1 12.0 - 15.0 g/dL   HCT 39.5 36.0 - 46.0 %   MCV 89.8 78.0 - 100.0 fL   MCH 29.8 26.0 - 34.0 pg   MCHC 33.2 30.0 - 36.0 g/dL   RDW 13.3 11.5 - 15.5 %   Platelets 229 150 - 400 K/uL   A/P: Satisfactory         D/C home

## 2017-03-30 NOTE — Discharge Instructions (Signed)
Laparoscopically Assisted Vaginal Hysterectomy, Care After °Refer to this sheet in the next few weeks. These instructions provide you with information on caring for yourself after your procedure. Your health care provider may also give you more specific instructions. Your treatment has been planned according to current medical practices, but problems sometimes occur. Call your health care provider if you have any problems or questions after your procedure. °What can I expect after the procedure? °After your procedure, it is typical to have the following: °· Abdominal pain. You will be given pain medicine to control it. °· Sore throat from the breathing tube that was inserted during surgery. ° °Follow these instructions at home: °· Only take over-the-counter or prescription medicines for pain, discomfort, or fever as directed by your health care provider. °· Do not take aspirin. It can cause bleeding. °· Do not drive when taking pain medicine. °· Follow your health care provider's advice regarding diet, exercise, lifting, driving, and general activities. °· Resume your usual diet as directed and allowed. °· Get plenty of rest and sleep. °· Do not douche, use tampons, or have sexual intercourse for at least 6 weeks, or until your health care provider gives you permission. °· Change your bandages (dressings) as directed by your health care provider. °· Monitor your temperature and notify your health care provider of a fever. °· Take showers instead of baths for 2-3 weeks. °· Do not drink alcohol until your health care provider gives you permission. °· If you develop constipation, you may take a mild laxative with your health care provider's permission. Bran foods may help with constipation problems. Drinking enough fluids to keep your urine clear or pale yellow may help as well. °· Try to have someone home with you for 1-2 weeks to help around the house. °· Keep all of your follow-up appointments as directed by your  health care provider. °Contact a health care provider if: °· You have swelling, redness, or increasing pain around your incision sites. °· You have pus coming from your incision. °· You notice a bad smell coming from your incision. °· Your incision breaks open. °· You feel dizzy or lightheaded. °· You have pain or bleeding when you urinate. °· You have persistent diarrhea. °· You have persistent nausea and vomiting. °· You have abnormal vaginal discharge. °· You have a rash. °· You have any type of abnormal reaction or develop an allergy to your medicine. °· You have poor pain control with your prescribed medicine. °Get help right away if: °· You have a fever. °· You have severe abdominal pain. °· You have chest pain. °· You have shortness of breath. °· You faint. °· You have pain, swelling, or redness in your leg. °· You have heavy vaginal bleeding with blood clots. °This information is not intended to replace advice given to you by your health care provider. Make sure you discuss any questions you have with your health care provider. °Document Released: 04/20/2011 Document Revised: 10/07/2015 Document Reviewed: 11/14/2012 °Elsevier Interactive Patient Education © 2017 [employer] ° °

## 2017-03-30 NOTE — Discharge Summary (Signed)
Physician Discharge Summary  Patient ID: Julie [name] MRN: [id number] DOB/AGE: 1981-01-27 36 y.o.  Admit date: 03/29/2017 Discharge date: 03/30/2017  Admission Diagnoses:manorrhagia  Discharge Diagnoses:  Active Problems:   Menorrhagia   Discharged Condition: good  Hospital Course: postoperatively had good resumption of bowel/bladder function. Tolerating regular diet and ambulating with good pain relief.  Consults: None  Significant Diagnostic Studies: labs:  Results for orders placed or performed during the hospital encounter of 03/29/17 (from the past 48 hour(s))  CBC     Status: None   Collection Time: 03/30/17  5:41 AM  Result Value Ref Range   WBC 10.2 4.0 - 10.5 K/uL   RBC 4.40 3.87 - 5.11 MIL/uL   Hemoglobin 13.1 12.0 - 15.0 g/dL   HCT 39.5 36.0 - 46.0 %   MCV 89.8 78.0 - 100.0 fL   MCH 29.8 26.0 - 34.0 pg   MCHC 33.2 30.0 - 36.0 g/dL   RDW 13.3 11.5 - 15.5 %   Platelets 229 150 - 400 K/uL    Treatments: surgery: LAVH/BS  Discharge Exam: Blood pressure 140/88, pulse 71, temperature 98 F (36.7 C), temperature source Oral, resp. rate 17, height 5\' 3"  (1.6 m), weight 195 lb (88.5 kg), last menstrual period 03/17/2017, SpO2 99 %, unknown if currently breastfeeding. General appearance: alert, cooperative and no distress GI: soft, non-tender; bowel sounds normal; no masses,  no organomegaly  Disposition: 01-Home or Self Care  Discharge Instructions    Discharge patient   Complete by:  As directed    Discharge disposition:  01-Home or Self Care   Discharge patient date:  03/30/2017     Allergies as of 03/30/2017      Reactions   Other    BAND-AID ADHESIVE (RASH)      Medication List    TAKE these medications   acetaminophen 500 MG tablet Commonly known as:  TYLENOL Take 1,000 mg by mouth every 6 (six) hours as needed (for pain.).   ibuprofen 600 MG tablet Commonly known as:  ADVIL,MOTRIN Take 1 tablet (600 mg total) every 6 (six) hours as needed  by mouth (mild pain). What changed:    medication strength  how much to take  when to take this  reasons to take this   oxyCODONE 5 MG immediate release tablet Commonly known as:  Oxy IR/ROXICODONE Take 1 tablet (5 mg total) every 6 (six) hours as needed by mouth for moderate pain.        Signed: Shon Millet II 03/30/2017, 4:43 PM

## 2017-03-30 NOTE — Op Note (Signed)
NAME:  Mosely, [place]                    ACCOUNT NO.:  MEDICAL RECORD NO.:  [id number]  LOCATION:                                 FACILITY:  PHYSICIAN:  Daleen Bo. Gaetano Net, M.D.      DATE OF BIRTH:  DATE OF PROCEDURE:  03/29/2017 DATE OF DISCHARGE:                              OPERATIVE REPORT   PREOPERATIVE DIAGNOSIS:  Menorrhagia.  POSTOPERATIVE DIAGNOSIS:  Menorrhagia.  PROCEDURE:  Laparoscopically assisted vaginal hysterectomy with bilateral salpingectomy.  SURGEON:  Daleen Bo. Gaetano Net, M.D.  ASSISTANT:  Darlyn Chamber, M.D.  ANESTHESIA:  General with endotracheal intubation.  ESTIMATED BLOOD LOSS:  200 mL.  SPECIMENS:  Uterus and bilateral fallopian tubes to Pathology.  INDICATIONS AND CONSENT:  This patient is a 36 year old patient with heavy menses.  Details are dictated in history and physical. Laparoscopically assisted vaginal hysterectomy with bilateral salpingo- oophorectomy and possible total abdominal hysterectomy have been discussed with the patient preoperatively.  Potential risks and complications have been reviewed preoperatively including, but not limited to, infection, organ damage, bleeding requiring transfusion of blood products with HIV and hepatitis acquisition, DVT, PE, pneumonia, fistula formation, pelvic pain, abdominal pain, and return to the operating room.  The patient states she understands and agrees.  FINDINGS:  Upper abdomen is grossly normal.  Appendix is normal.  Uterus is 10 weeks in size, smooth in contour, and free of adhesion.  The anterior and posterior cul-de-sacs are normal with some minor scarring in the vesicouterine peritoneum secondary to cesarean section.  Tubes and ovaries were normal bilaterally.  DESCRIPTION OF PROCEDURE:  The patient was taken to the operating room, where she was placed in a dorsal supine position, and general anesthesia was induced via endotracheal intubation after she was identified.  She was then  placed in a dorsal lithotomy position.  Time-out was undertaken.  She was then prepped abdominally with ChloraPrep and vaginally with Betadine.  Bladder straight catheterized.  Hulka tenaculum was placed in the uterus as a manipulator and after 3-minute drying time, she was draped in a sterile fashion.  The infraumbilical and suprapubic areas were injected with 0.5% Marcaine plain about 7 mL total.  A small infraumbilical incision was made.  Disposable Veress needle was placed on first attempt without difficulty and good drop and syringe test were noted.  2 L of gas was then insufflated under low pressure with good tympany in the right upper quadrant.  Veress needle was removed and a 10/11 Xcel bladeless disposable trocar sleeve was placed using direct visualization with the diagnostic laparoscope. Following that, the operative scope was used.  Small suprapubic incision was made in the midline.  A 5 mm disposable trocar sleeve was placed under direct visualization without difficulty.  The above findings were noted.  Then, using the EnSeal bipolar cautery cutting instrument, the right fallopian tube was taken down, coming across the proximal ligaments down the level of vesicouterine peritoneum.  Similar procedure was carried out on the left.  Vesicouterine peritoneum was taken down in the midline.  Instruments were removed.  Suprapubic trocar sleeve was removed and attention was turned to the vagina.  Posterior cul-de-sac was sharply  entered.  Cervix was circumscribed with unipolar cautery. Mucosa was advanced sharply and bluntly.  Then, using the LigaSure handheld bipolar cautery cutting instrument, the uterosacral ligaments, cardinal ligaments, and vessels were taken down bilaterally.  Anterior cul-de-sac was also entered successfully.  The fundus was delivered posteriorly.  The remaining pedicles were taken down and the specimen was delivered.  All suture will be 0 Monocryl unless  otherwise designated.  Uterosacral ligaments were plicated with the cuff bilaterally with separate suture and plicated in the midline with a third suture.  Cuff was closed with figure-of-eights.  Foley catheter was placed in the bladder and the clear urine was noted.  Attention was returned to the abdomen.  Pneumoperitoneum was reintroduced and the suprapubic trocar sleeve was reintroduced under direct visualization. Copious irrigation was carried out.  Minor oozing at peritoneal edges was controlled with bipolar cautery.  Inspection under reduced pneumoperitoneum reveals continued hemostasis.  The remaining approximately 23 mL of 0.5% plain Marcaine was instilled into the peritoneal cavity.  Instruments were removed.  Pneumoperitoneum was reduced.  Trocar sleeves were removed.  Umbilical incision was closed with 0 Vicryl in subcutaneous layer under good visualization and the skin on both was closed with interrupted 4-0 Vicryl.  Dermabond was placed on both.  All counts were correct.  The patient was awakened and taken to recovery room in stable condition.     Daleen Bo Gaetano Net, M.D.     JET/MEDQ  D:  03/29/2017  T:  03/30/2017  Job:  [id number]

## 2019-08-17 ENCOUNTER — Ambulatory Visit (HOSPITAL_COMMUNITY)
Admission: EM | Admit: 2019-08-17 | Discharge: 2019-08-17 | Disposition: A | Discharge status: Home / Self Care | Payer: BLUE CROSS/BLUE SHIELD | Attending: Family Medicine | Admitting: Family Medicine

## 2019-08-17 ENCOUNTER — Encounter (HOSPITAL_COMMUNITY): Payer: Self-pay

## 2019-08-17 ENCOUNTER — Other Ambulatory Visit: Payer: Self-pay

## 2019-08-17 DIAGNOSIS — Z20822 Contact with and (suspected) exposure to covid-19: Secondary | ICD-10-CM | POA: Diagnosis present

## 2019-08-17 DIAGNOSIS — R0981 Nasal congestion: Secondary | ICD-10-CM | POA: Insufficient documentation

## 2019-08-17 HISTORY — DX: Disorder of thyroid, unspecified: E07.9

## 2019-08-17 LAB — SARS CORONAVIRUS 2 (TAT 6-24 HRS): SARS Coronavirus 2: NEGATIVE

## 2019-08-17 NOTE — ED Triage Notes (Signed)
Pt requests COVID test. States she had some nasal congestion last week, which resolved. Pt's daughter being seen today for URI sx, fever and COVID testing. Pt denies abdom pain, n/v/d, fever, chills or other complaint.

## 2019-08-17 NOTE — ED Provider Notes (Signed)
[facility]    CSN: [id number] Arrival date & time: 08/17/19  1614      History   Chief Complaint Chief Complaint  Patient presents with  . covid test    HPI Julie [name] is a 39 y.o. female.   Patient reports that her whole family has been sick.  Reports that she has been experiencing nasal congestion, cough, postnasal drip for the last week.  But she thinks that this is improving.  She is still concerned about having Covid.  Denies headache, shortness of breath, sore throat, chest tightness, nausea, vomiting, diarrhea, chills, body aches, rash, fever, other symptoms.  Per chart review, patient has medical history significant for asthma, thyroid disease, blood dyscrasia (patient bleeds easily).  ROS per HPI  The history is provided by the patient.    Past Medical History:  Diagnosis Date  . Asthma    as a child  . Blood dyscrasia    bleeds easily  . Medical history non-contributory   . Thyroid disease     Patient Active Problem List   Diagnosis Date Noted  . Menorrhagia 03/29/2017    Past Surgical History:  Procedure Laterality Date  . CESAREAN SECTION WITH BILATERAL TUBAL LIGATION Bilateral 12/04/2012   Procedure: PRIMARY CESAREAN SECTION WITH BILATERAL TUBAL LIGATION;  Surgeon: Allena Katz, MD;  Location: [facility];  Service: Obstetrics;  Laterality: Bilateral;  PRIMARY  edc 8/5  . LAPAROSCOPIC VAGINAL HYSTERECTOMY WITH SALPINGECTOMY Bilateral 03/29/2017   Procedure: LAPAROSCOPIC ASSISTED VAGINAL HYSTERECTOMY WITH SALPINGECTOMY;  Surgeon: Everlene Farrier, MD;  Location: [facility];  Service: Gynecology;  Laterality: Bilateral;  . MYOMECTOMY  2012  . TONSILLECTOMY    . TUBAL LIGATION     during C/S    OB History    Gravida  2   Para  2   Term  1   Preterm  1   AB      Living  2     SAB      TAB      Ectopic      Multiple      Live Births  1            Home Medications    Prior to Admission medications   Medication Sig  Start Date End Date Taking? Authorizing Provider  acetaminophen (TYLENOL) 500 MG tablet Take 1,000 mg by mouth every 6 (six) hours as needed (for pain.).    [provider]  ibuprofen (ADVIL,MOTRIN) 600 MG tablet Take 1 tablet (600 mg total) every 6 (six) hours as needed by mouth (mild pain). 03/30/17   Everlene Farrier, MD  NP THYROID 30 MG tablet Take 30 mg by mouth daily. 07/17/19   [provider]  oxyCODONE (OXY IR/ROXICODONE) 5 MG immediate release tablet Take 1 tablet (5 mg total) every 6 (six) hours as needed by mouth for moderate pain. 03/30/17   Everlene Farrier, MD  Testosterone Enanthate 100 MG/0.5ML SOAJ testosterone  125 mg q 3 months    [provider]    Family History Family History  Problem Relation Age of Onset  . Healthy Mother   . Heart failure Father     Social History Social History   Tobacco Use  . Smoking status: Never Smoker  . Smokeless tobacco: Never Used  Substance Use Topics  . Alcohol use: No  . Drug use: No     Allergies   Other   Review of Systems Review of Systems  Physical Exam Triage Vital Signs ED Triage Vitals  Enc Vitals Group     BP      Pulse      Resp      Temp      Temp src      SpO2      Weight      Height      Head Circumference      Peak Flow      Pain Score      Pain Loc      Pain Edu?      Excl. in [place]?    No data found.  Updated Vital Signs BP 130/88 (BP Location: Left Arm)   Pulse (!) 101   Temp 99.1 F (37.3 C) (Oral)   Resp 18   LMP 03/17/2017 (Exact Date)   SpO2 98%   Visual Acuity Right Eye Distance:   Left Eye Distance:   Bilateral Distance:    Right Eye Near:   Left Eye Near:    Bilateral Near:     Physical Exam Vitals and nursing note reviewed.  Constitutional:      General: She is not in acute distress.    Appearance: Normal appearance. She is well-developed and normal weight. She is not ill-appearing.  HENT:     Head: Normocephalic and atraumatic.     Right  Ear: Tympanic membrane normal.     Left Ear: Tympanic membrane normal.     Nose: Nose normal.     Mouth/Throat:     Mouth: Mucous membranes are moist.     Pharynx: Oropharynx is clear.  Eyes:     Extraocular Movements: Extraocular movements intact.     Conjunctiva/sclera: Conjunctivae normal.     Pupils: Pupils are equal, round, and reactive to light.  Cardiovascular:     Rate and Rhythm: Normal rate and regular rhythm.     Heart sounds: Normal heart sounds. No murmur.  Pulmonary:     Effort: Pulmonary effort is normal. No respiratory distress.     Breath sounds: Normal breath sounds. No stridor. No wheezing, rhonchi or rales.  Chest:     Chest wall: No tenderness.  Abdominal:     General: Bowel sounds are normal. There is no distension.     Palpations: Abdomen is soft. There is no mass.     Tenderness: There is no abdominal tenderness. There is no right CVA tenderness, left CVA tenderness, guarding or rebound.     Hernia: No hernia is present.  Musculoskeletal:        General: Normal range of motion.     Cervical back: Normal range of motion and neck supple.  Skin:    General: Skin is warm and dry.  Neurological:     General: No focal deficit present.     Mental Status: She is alert and oriented to person, place, and time.  Psychiatric:        Mood and Affect: Mood normal.        Behavior: Behavior normal.        Thought Content: Thought content normal.      UC Treatments / Results  Labs (all labs ordered are listed, but only abnormal results are displayed) Labs Reviewed  SARS CORONAVIRUS 2 (TAT 6-24 HRS)    EKG   Radiology No results found.  Procedures Procedures (including critical care time)  Medications Ordered in UC Medications - No data to display  Initial Impression / Assessment and Plan / UC  Course  I have reviewed the triage vital signs and the nursing notes.  Pertinent labs & imaging results that were available during my care of the patient were  reviewed by me and considered in my medical decision making (see chart for details).     Suspected exposure to COVID-19: Patient is concerned that she has been exposed to Covid.  Denies knowing anybody who has had a positive Covid test.  Patient seems fearful during the visit.  Patient is asymptomatic and physical exam is benign.  Covid swab obtained current will inform patient when results are back.  Patient instructed to quarantine until results are back and negative.  If results are negative, patient may resume work at regular schedule.  If results are positive, patient may resume work 10 days from today.  Patient instructed to follow-up with the ER for trouble swallowing, trouble breathing, other concerning symptoms. Final Clinical Impressions(s) / UC Diagnoses   Final diagnoses:  Suspected COVID-19 virus infection  Nasal congestion     Discharge Instructions     Your COVID test is pending.  You should self quarantine until the test result is back.    Take Tylenol as needed for fever or discomfort.  Rest and keep yourself hydrated.    Go to the emergency department if you develop shortness of breath, severe diarrhea, high fever not relieved by Tylenol or ibuprofen, or other concerning symptoms.       ED Prescriptions    None     PDMP not reviewed this encounter.   Faustino Congress, NP 08/18/19 1954

## 2019-08-17 NOTE — Discharge Instructions (Signed)
Your COVID test is pending.  You should self quarantine until the test result is back.    Take Tylenol as needed for fever or discomfort.  Rest and keep yourself hydrated.    Go to the emergency department if you develop shortness of breath, severe diarrhea, high fever not relieved by Tylenol or ibuprofen, or other concerning symptoms.    

## 2023-08-28 ENCOUNTER — Encounter: Payer: Self-pay | Admitting: Internal Medicine

## 2023-09-21 ENCOUNTER — Encounter: Payer: Self-pay | Admitting: Internal Medicine

## 2023-09-21 ENCOUNTER — Ambulatory Visit (AMBULATORY_SURGERY_CENTER): Admitting: *Deleted

## 2023-09-21 VITALS — Ht 63.0 in | Wt 189.0 lb

## 2023-09-21 DIAGNOSIS — Z1211 Encounter for screening for malignant neoplasm of colon: Secondary | ICD-10-CM

## 2023-09-21 MED ORDER — NA SULFATE-K SULFATE-MG SULF 17.5-3.13-1.6 GM/177ML PO SOLN: 1.0000 | Freq: Once | ORAL | 0 refills | Status: AC

## 2023-09-21 NOTE — Progress Notes (Signed)
 Pre visit completed over telephone. Instructions forwarded through secure email   No egg or soy allergy known to patient  No issues known to pt with past sedation with any surgeries or procedures Patient denies ever being told they had issues or difficulty with intubation  No FH of Malignant Hyperthermia Pt is not on diet pills Pt is not on  home 02  Pt is not on blood thinners  Pt denies issues with constipation  No A fib or A flutter no Have any cardiac testing pending--no Pt instructed to use [email or web address] or GoodRx for a price reduction on prep

## 2023-10-11 ENCOUNTER — Other Ambulatory Visit

## 2023-10-11 ENCOUNTER — Other Ambulatory Visit: Payer: Self-pay

## 2023-10-11 ENCOUNTER — Ambulatory Visit: Admitting: Internal Medicine

## 2023-10-11 ENCOUNTER — Encounter: Payer: Self-pay | Admitting: Internal Medicine

## 2023-10-11 VITALS — BP 117/71 | HR 70 | Temp 99.5°F | Resp 16 | Ht 63.0 in | Wt 189.0 lb

## 2023-10-11 DIAGNOSIS — D122 Benign neoplasm of ascending colon: Secondary | ICD-10-CM

## 2023-10-11 DIAGNOSIS — K9041 Non-celiac gluten sensitivity: Secondary | ICD-10-CM

## 2023-10-11 DIAGNOSIS — Z1211 Encounter for screening for malignant neoplasm of colon: Secondary | ICD-10-CM

## 2023-10-11 DIAGNOSIS — K635 Polyp of colon: Secondary | ICD-10-CM | POA: Diagnosis not present

## 2023-10-11 DIAGNOSIS — K648 Other hemorrhoids: Secondary | ICD-10-CM

## 2023-10-11 DIAGNOSIS — D123 Benign neoplasm of transverse colon: Secondary | ICD-10-CM

## 2023-10-11 DIAGNOSIS — D125 Benign neoplasm of sigmoid colon: Secondary | ICD-10-CM

## 2023-10-11 DIAGNOSIS — K621 Rectal polyp: Secondary | ICD-10-CM | POA: Diagnosis not present

## 2023-10-11 DIAGNOSIS — D128 Benign neoplasm of rectum: Secondary | ICD-10-CM

## 2023-10-11 DIAGNOSIS — D120 Benign neoplasm of cecum: Secondary | ICD-10-CM

## 2023-10-11 MED ORDER — SODIUM CHLORIDE 0.9 % IV SOLN: 500.0000 mL | Freq: Once | INTRAVENOUS | Status: DC

## 2023-10-11 NOTE — Progress Notes (Signed)
 Called to room to assist during endoscopic procedure.  Patient ID and intended procedure confirmed with present staff. Received instructions for my participation in the procedure from the performing physician.

## 2023-10-11 NOTE — Op Note (Signed)
 [facility] Patient Name: Dynver Clemson Procedure Date: 10/11/2023 9:04 AM MRN: [id number] Endoscopist: Freada Jacobs [place] , , [id number] Age: 43 Referring MD:  Date of Birth: Jun 04, 1980 Gender: Female Account #: [id number] Procedure:                Colonoscopy Indications:              Screening for colorectal malignant neoplasm, This                            is the patient's first colonoscopy Medicines:                Monitored Anesthesia Care Procedure:                Pre-Anesthesia Assessment:                           - Prior to the procedure, a History and Physical                            was performed, and patient medications and                            allergies were reviewed. The patient's tolerance of                            previous anesthesia was also reviewed. The risks                            and benefits of the procedure and the sedation                            options and risks were discussed with the patient.                            All questions were answered, and informed consent                            was obtained. Prior Anticoagulants: The patient has                            taken no anticoagulant or antiplatelet agents. ASA                            Grade Assessment: II - A patient with mild systemic                            disease. After reviewing the risks and benefits,                            the patient was deemed in satisfactory condition to                            undergo the procedure.  After obtaining informed consent, the colonoscope                            was passed under direct vision. Throughout the                            procedure, the patient's blood pressure, pulse, and                            oxygen saturations were monitored continuously. The                            CF [id number] #[id number] was introduced through the anus                            and advanced to  the the terminal ileum. The                            colonoscopy was performed without difficulty. The                            patient tolerated the procedure well. The quality                            of the bowel preparation was excellent. The                            terminal ileum, ileocecal valve, appendiceal                            orifice, and rectum were photographed. Scope In: 9:13:27 AM Scope Out: 9:32:23 AM Scope Withdrawal Time: 0 hours 16 minutes 18 seconds  Total Procedure Duration: 0 hours 18 minutes 56 seconds  Findings:                 The terminal ileum appeared normal.                           Six sessile polyps were found in the transverse                            colon, ascending colon and cecum. The polyps were 3                            to 10 mm in size. These polyps were removed with a                            cold snare. Resection and retrieval were complete.                           Two sessile polyps were found in the rectum and                            sigmoid colon. The  polyps were 3 to 8 mm in size.                            These polyps were removed with a cold snare.                            Resection and retrieval were complete.                           Non-bleeding internal hemorrhoids were found during                            retroflexion. Complications:            No immediate complications. Estimated Blood Loss:     Estimated blood loss was minimal. Impression:               - The examined portion of the ileum was normal.                           - Six 3 to 10 mm polyps in the transverse colon, in                            the ascending colon and in the cecum, removed with                            a cold snare. Resected and retrieved.                           - Two 3 to 8 mm polyps in the rectum and in the                            sigmoid colon, removed with a cold snare. Resected                            and  retrieved.                           - Non-bleeding internal hemorrhoids. Recommendation:           - Discharge patient to home (with escort).                           - Await pathology results.                           - Will check TTG IgA and IgA to evaluate celiac                            disease since patient describes some gluten                            intolerance.                           - Return  to GI clinic in 2-3 months.                           - The findings and recommendations were discussed                            with the patient. Dr Pedro Bourgeois "Anastacio Balm" Rosaline Coma,  10/11/2023 9:35:59 AM

## 2023-10-11 NOTE — Progress Notes (Signed)
 Sedate, gd SR, tolerated procedure well, VSS, report to RN

## 2023-10-11 NOTE — Progress Notes (Signed)
 Pt's states no medical or surgical changes since previsit or office visit.

## 2023-10-11 NOTE — Patient Instructions (Signed)
-  Handout on polyps and hemorrhoids provided. -await pathology results. -Return to GI clinic 2-3 months  YOU HAD AN ENDOSCOPIC PROCEDURE TODAY AT THE [facility]:   Refer to the procedure report that was given to you for any specific questions about what was found during the examination.  If the procedure report does not answer your questions, please call your gastroenterologist to clarify.  If you requested that your care partner not be given the details of your procedure findings, then the procedure report has been included in a sealed envelope for you to review at your convenience later.  YOU SHOULD EXPECT: Some feelings of bloating in the abdomen. Passage of more gas than usual.  Walking can help get rid of the air that was put into your GI tract during the procedure and reduce the bloating. If you had a lower endoscopy (such as a colonoscopy or flexible sigmoidoscopy) you may notice spotting of blood in your stool or on the toilet paper. If you underwent a bowel prep for your procedure, you may not have a normal bowel movement for a few days.  Please Note:  You might notice some irritation and congestion in your nose or some drainage.  This is from the oxygen used during your procedure.  There is no need for concern and it should clear up in a day or so.  SYMPTOMS TO REPORT IMMEDIATELY:  Following lower endoscopy (colonoscopy or flexible sigmoidoscopy):  Excessive amounts of blood in the stool  Significant tenderness or worsening of abdominal pains  Swelling of the abdomen that is new, acute  Fever of 100F or higher  For urgent or emergent issues, a gastroenterologist can be reached at any hour by calling [phone]. Do not use MyChart messaging for urgent concerns.    DIET:  We do recommend a small meal at first, but then you may proceed to your regular diet.  Drink plenty of fluids but you should avoid alcoholic beverages for 24 hours.  ACTIVITY:  You should plan to  take it easy for the rest of today and you should NOT DRIVE or use heavy machinery until tomorrow (because of the sedation medicines used during the test).    FOLLOW UP: Our staff will call the number listed on your records the next business day following your procedure.  We will call around 7:15- 8:00 am to check on you and address any questions or concerns that you may have regarding the information given to you following your procedure. If we do not reach you, we will leave a message.     If any biopsies were taken you will be contacted by phone or by letter within the next 1-3 weeks.  Please call us  at [phone] if you have not heard about the biopsies in 3 weeks.    SIGNATURES/CONFIDENTIALITY: You and/or your care partner have signed paperwork which will be entered into your electronic medical record.  These signatures attest to the fact that that the information above on your After Visit Summary has been reviewed and is understood.  Full responsibility of the confidentiality of this discharge information lies with you and/or your care-partner.

## 2023-10-11 NOTE — Progress Notes (Signed)
 GASTROENTEROLOGY PROCEDURE H&P NOTE   Primary Care Physician: Lenor Raddle, MD (Inactive)    Reason for Procedure:   Colon cancer screening  Plan:    Colonoscopy  Patient is appropriate for endoscopic procedure(s) in the ambulatory ([facility]) setting.  The nature of the procedure, as well as the risks, benefits, and alternatives were carefully and thoroughly reviewed with the patient. Ample time for discussion and questions allowed. The patient understood, was satisfied, and agreed to proceed.     HPI: Julie [name] is a 43 y.o. female who presents for colonoscopy for colon cancer screening. Denies blood in stools, changes in bowel habits, or unintentional weight loss. Denies family history of colon cancer.  Past Medical History:  Diagnosis Date   Asthma    as a child   Blood dyscrasia    bleeds easily   Medical history non-contributory    Thyroid disease     Past Surgical History:  Procedure Laterality Date   CESAREAN SECTION WITH BILATERAL TUBAL LIGATION Bilateral 12/04/2012   Procedure: PRIMARY CESAREAN SECTION WITH BILATERAL TUBAL LIGATION;  Surgeon: Jeanmarie Millet, MD;  Location: [facility];  Service: Obstetrics;  Laterality: Bilateral;  PRIMARY  edc 8/5   LAPAROSCOPIC VAGINAL HYSTERECTOMY WITH SALPINGECTOMY Bilateral 03/29/2017   Procedure: LAPAROSCOPIC ASSISTED VAGINAL HYSTERECTOMY WITH SALPINGECTOMY;  Surgeon: Thora Flint, MD;  Location: [facility];  Service: Gynecology;  Laterality: Bilateral;   MYOMECTOMY  2012   TONSILLECTOMY     TUBAL LIGATION     during [name]/S    Prior to Admission medications   Medication Sig Start Date End Date Taking? Authorizing Provider  acetaminophen  (TYLENOL ) 500 MG tablet Take 1,000 mg by mouth every 6 (six) hours as needed (for pain.).    [provider]  ARMOUR THYROID 60 MG tablet Take 60 mg by mouth daily.    [provider]  ibuprofen  (ADVIL ,MOTRIN ) 600 MG tablet Take 1 tablet (600 mg total) every 6 (six)  hours as needed by mouth (mild pain). 03/30/17   Tomblin, James, MD  spironolactone (ALDACTONE) 100 MG tablet     [provider]  Testosterone Enanthate 100 MG/0.5ML SOAJ testosterone  125 mg q 3 months    [provider]    Current Outpatient Medications  Medication Sig Dispense Refill   acetaminophen  (TYLENOL ) 500 MG tablet Take 1,000 mg by mouth every 6 (six) hours as needed (for pain.).     ARMOUR THYROID 15 MG tablet Take 15 mg by mouth daily.     ARMOUR THYROID 60 MG tablet Take 75 mg by mouth daily.     ibuprofen  (ADVIL ,MOTRIN ) 600 MG tablet Take 1 tablet (600 mg total) every 6 (six) hours as needed by mouth (mild pain). (Patient not taking: Reported on 10/11/2023) 60 tablet 0   spironolactone (ALDACTONE) 100 MG tablet  (Patient not taking: Reported on 10/11/2023)     Testosterone Enanthate 100 MG/0.5ML SOAJ testosterone  125 mg q 3 months     Current Facility-Administered Medications  Medication Dose Route Frequency Provider Last Rate Last Admin   0.9 %  sodium chloride  infusion  500 mL Intravenous Once Julie Granholm C, MD        Allergies as of 10/11/2023 - Review Complete 10/11/2023  Allergen Reaction Noted   Other Rash 03/15/2017    Family History  Problem Relation Age of Onset   Colon polyps Mother    Healthy Mother    Colon polyps Father    Heart failure Father  Colon polyps Sister    Colon cancer Neg Hx     Social History   Socioeconomic History   Marital status: Married    Spouse name: Not on file   Number of children: Not on file   Years of education: Not on file   Highest education level: Not on file  Occupational History   Not on file  Tobacco Use   Smoking status: Never   Smokeless tobacco: Never  Vaping Use   Vaping status: Never Used  Substance and Sexual Activity   Alcohol use: No   Drug use: No   Sexual activity: Yes    Birth control/protection: Surgical  Other Topics Concern   Not on file  Social History Narrative    Not on file   Social Drivers of Health   Financial Resource Strain: Not on file  Food Insecurity: Not on file  Transportation Needs: Not on file  Physical Activity: Not on file  Stress: Not on file  Social Connections: Not on file  Intimate Partner Violence: Not on file    Physical Exam: Vital signs in last 24 hours: BP 108/71   Pulse 70   Temp 99.5 F (37.5 [name]) (Temporal)   Resp (!) 23   Ht 5\' 3"  (1.6 m)   Wt 189 lb (85.7 kg)   LMP 03/17/2017 (Exact Date)   SpO2 95%   Breastfeeding No   BMI 33.48 kg/m  GEN: NAD EYE: Sclerae anicteric ENT: MMM CV: Non-tachycardic Pulm: No increased work of breathing GI: Soft, NT/ND NEURO:  Alert & Oriented   Julie Caprio, MD [facility]  10/11/2023 9:36 AM

## 2023-10-12 ENCOUNTER — Telehealth: Payer: Self-pay

## 2023-10-12 LAB — TISSUE TRANSGLUTAMINASE, IGA: (tTG) Ab, IgA: 2.3 U/mL

## 2023-10-12 LAB — IGA
Immunoglobulin A: 116 mg/dL (ref 47–310)
Immunoglobulin A: 113 mg/dL (ref 47–310)

## 2023-10-12 NOTE — Telephone Encounter (Signed)
  Follow up Call-     10/11/2023    8:17 AM  Call back number  Post procedure Call Back phone  # [phone]  Permission to leave phone message Yes     Patient questions:  Do you have a fever, pain , or abdominal swelling? No. Pain Score  0 *  Have you tolerated food without any problems? Yes.    Have you been able to return to your normal activities? Yes.    Do you have any questions about your discharge instructions: Diet   No. Medications  No. Follow up visit  No.  Do you have questions or concerns about your Care? No.  Actions: * If pain score is 4 or above: No action needed, pain <4.

## 2023-10-15 ENCOUNTER — Ambulatory Visit: Payer: Self-pay | Admitting: Internal Medicine

## 2023-10-16 LAB — SURGICAL PATHOLOGY

## 2023-12-13 ENCOUNTER — Ambulatory Visit: Admitting: Internal Medicine
